# Patient Record
Sex: Female | Born: 1960 | Race: White | Hispanic: No | Marital: Married | Smoking: Never smoker
Health system: Southern US, Community
[De-identification: ages and names within clinical notes are randomized; demographics above are authoritative.]

## PROBLEM LIST (undated history)

## (undated) DIAGNOSIS — I219 Acute myocardial infarction, unspecified: Secondary | ICD-10-CM

## (undated) DIAGNOSIS — K219 Gastro-esophageal reflux disease without esophagitis: Secondary | ICD-10-CM

## (undated) DIAGNOSIS — E785 Hyperlipidemia, unspecified: Secondary | ICD-10-CM

## (undated) DIAGNOSIS — J45909 Unspecified asthma, uncomplicated: Secondary | ICD-10-CM

## (undated) HISTORY — DX: Hyperlipidemia, unspecified: E78.5

## (undated) HISTORY — PX: CHOLECYSTECTOMY: SHX55

## (undated) HISTORY — PX: ABDOMINAL HYSTERECTOMY: SHX81

## (undated) HISTORY — DX: Unspecified asthma, uncomplicated: J45.909

## (undated) HISTORY — DX: Gastro-esophageal reflux disease without esophagitis: K21.9

---

## 2007-12-23 ENCOUNTER — Other Ambulatory Visit: Admission: RE | Admit: 2007-12-23 | Discharge: 2007-12-23 | Payer: Self-pay | Admitting: Internal Medicine

## 2008-01-31 ENCOUNTER — Ambulatory Visit (HOSPITAL_COMMUNITY): Admission: RE | Admit: 2008-01-31 | Discharge: 2008-01-31 | Payer: Self-pay | Admitting: Internal Medicine

## 2009-11-18 ENCOUNTER — Observation Stay (HOSPITAL_COMMUNITY): Admission: EM | Admit: 2009-11-18 | Discharge: 2009-11-19 | Payer: Self-pay | Admitting: Emergency Medicine

## 2009-11-19 ENCOUNTER — Encounter (INDEPENDENT_AMBULATORY_CARE_PROVIDER_SITE_OTHER): Payer: Self-pay | Admitting: General Surgery

## 2011-01-29 LAB — CBC
Hemoglobin: 13.4 g/dL (ref 12.0–15.0)
MCHC: 33.1 g/dL (ref 30.0–36.0)
MCV: 89.2 fL (ref 78.0–100.0)
Platelets: 251 10*3/uL (ref 150–400)
RDW: 13.3 % (ref 11.5–15.5)
WBC: 9.6 10*3/uL (ref 4.0–10.5)

## 2011-01-29 LAB — DIFFERENTIAL
Basophils Absolute: 0.1 10*3/uL (ref 0.0–0.1)
Basophils Relative: 1 % (ref 0–1)
Eosinophils Absolute: 0.1 10*3/uL (ref 0.0–0.7)
Neutro Abs: 6.7 10*3/uL (ref 1.7–7.7)
Neutrophils Relative %: 70 % (ref 43–77)

## 2011-01-29 LAB — URINALYSIS, ROUTINE W REFLEX MICROSCOPIC
Bilirubin Urine: NEGATIVE
Nitrite: NEGATIVE
Specific Gravity, Urine: 1.02 (ref 1.005–1.030)
Urobilinogen, UA: 0.2 mg/dL (ref 0.0–1.0)
pH: 5 (ref 5.0–8.0)

## 2011-01-29 LAB — LIPASE, BLOOD: Lipase: 24 U/L (ref 11–59)

## 2011-01-29 LAB — BASIC METABOLIC PANEL
GFR calc Af Amer: >60 mL/min (ref >60)
Potassium: 4.2 mEq/L (ref 3.5–5.1)
Sodium: 139 mEq/L (ref 135–145)

## 2011-01-29 LAB — HEPATIC FUNCTION PANEL
ALT: 13 U/L (ref 0–35)
AST: 16 U/L (ref 0–37)
Alkaline Phosphatase: 72 U/L (ref 39–117)
Indirect Bilirubin: 0.9 mg/dL (ref 0.3–0.9)
Total Protein: 7.2 g/dL (ref 6.0–8.3)

## 2011-03-31 NOTE — Op Note (Signed)
Jennifer Morton, Jennifer Morton                 ACCOUNT NO.:  [id number]   MEDICAL RECORD NO.:  [id number]          PATIENT TYPE:  OBV   LOCATION:  A311                          FACILITY:  [facility]   PHYSICIAN:  Dalia Heading, M.D.  DATE OF BIRTH:  1960/11/18   DATE OF PROCEDURE:  11/19/2008  DATE OF DISCHARGE:                               OPERATIVE REPORT   PREOPERATIVE DIAGNOSES:  Cholecystitis, cholelithiasis.   POSTOPERATIVE DIAGNOSIS:  Cholecystitis, cholelithiasis.   PROCEDURE:  Laparoscopic cholecystectomy.   SURGEON:  Dalia Heading, MD   ANESTHESIA:  General endotracheal.   INDICATIONS:  The patient is a 50 year old [place] female who presents  with worsening right upper quadrant abdominal pain, nausea, vomiting.  Ultrasound of the gallbladder reveals cholelithiasis with the gallstone  impacted in the neck of the gallbladder.  The patient comes to the  operating for laparoscopic cholecystectomy.  The risks and benefits of  the procedure including bleeding, infection, hepatobiliary injury, the  possibility of an open procedure were fully explained to the patient,  gave informed consent.   PROCEDURE NOTE:  The patient was placed in supine position. After  induction of general endotracheal anesthesia, the abdomen was prepped  and draped using the usual sterile technique with Betadine. Surgical  site confirmation was performed.   A supraumbilical incision was made down to the fascia.  A Veress needle  was introduced into the abdominal cavity and confirmation of placement  was done using the saline drop test.  The abdomen was then insufflated  to 16 mmHg pressure.  An 11-mm trocar was introduced into the abdominal  cavity under direct visualization without difficulty.  The patient was  placed in reversed Trendelenburg position.  Additional 11-mm trocar was  placed in the epigastric region and 5-mm trocars were placed in the  right upper quadrant and right flank regions.  Liver was  inspected and  noted to be within normal limits.  The gallbladder was retracted  superiorly and laterally.  The dissection was begun around the  infundibulum of the gallbladder.  The cystic duct was first identified.  The juncture to the infundibulum fully identified.  Endo clips were  placed proximally and distally on the cystic duct and the cystic duct  was divided.  This was likewise done on cystic artery.  The gallbladder  was then freed away from the gallbladder fossa using Bovie  electrocautery.  The gallbladder was delivered through the epigastric  trocar site using EndoCatch bag.  The gallbladder fossa was inspected.  No abnormal bleeding or bile leakage was noted.  Surgicel was placed in  the gallbladder fossa.  All fluid and air were then evacuated from the  abdominal cavity prior to the removal of the trocars.   All wounds were irrigated with normal saline.  All wounds were checked  with 0.5% Sensorcaine.  The supraumbilical fascia was reapproximated  using an 0 Vicryl interrupted suture.  All skin incisions were closed  using staples.  Betadine ointment and dry sterile dressings were  applied.   All tape and needle counts were correct at the end  of the procedure.  The patient was extubated in the operating room and went back to  recovery room in awake and stable condition.   COMPLICATIONS:  None.   SPECIMEN:  Gallbladder.   BLOOD LOSS:  Minimal.      Dalia Heading, M.D.  Electronically Signed     MAJ/MEDQ  D:  11/19/2009  T:  11/20/2009  Job:  [id number]

## 2011-12-08 ENCOUNTER — Other Ambulatory Visit (HOSPITAL_COMMUNITY): Payer: Self-pay | Admitting: Physician Assistant

## 2011-12-08 DIAGNOSIS — Z1231 Encounter for screening mammogram for malignant neoplasm of breast: Secondary | ICD-10-CM

## 2011-12-11 ENCOUNTER — Ambulatory Visit (HOSPITAL_COMMUNITY)
Admission: RE | Admit: 2011-12-11 | Discharge: 2011-12-11 | Disposition: A | Discharge status: Home / Self Care | Payer: No Typology Code available for payment source | Source: Ambulatory Visit | Attending: Physician Assistant | Admitting: Physician Assistant

## 2011-12-11 DIAGNOSIS — Z1231 Encounter for screening mammogram for malignant neoplasm of breast: Secondary | ICD-10-CM

## 2012-05-26 ENCOUNTER — Emergency Department (HOSPITAL_COMMUNITY)
Admission: EM | Admit: 2012-05-26 | Discharge: 2012-05-26 | Disposition: A | Discharge status: Home / Self Care | Payer: No Typology Code available for payment source | Attending: Emergency Medicine | Admitting: Emergency Medicine

## 2012-05-26 ENCOUNTER — Emergency Department (HOSPITAL_COMMUNITY): Payer: No Typology Code available for payment source

## 2012-05-26 ENCOUNTER — Encounter (HOSPITAL_COMMUNITY): Payer: Self-pay

## 2012-05-26 DIAGNOSIS — S40019A Contusion of unspecified shoulder, initial encounter: Secondary | ICD-10-CM | POA: Insufficient documentation

## 2012-05-26 DIAGNOSIS — S63502A Unspecified sprain of left wrist, initial encounter: Secondary | ICD-10-CM

## 2012-05-26 DIAGNOSIS — W010XXA Fall on same level from slipping, tripping and stumbling without subsequent striking against object, initial encounter: Secondary | ICD-10-CM | POA: Insufficient documentation

## 2012-05-26 DIAGNOSIS — S40012A Contusion of left shoulder, initial encounter: Secondary | ICD-10-CM

## 2012-05-26 DIAGNOSIS — S63509A Unspecified sprain of unspecified wrist, initial encounter: Secondary | ICD-10-CM | POA: Insufficient documentation

## 2012-05-26 MED ORDER — ONDANSETRON HCL 4 MG PO TABS
4.0000 mg | ORAL_TABLET | Freq: Once | ORAL | Status: AC
  Administered 2012-05-26: 4 mg via ORAL
  Filled 2012-05-26: qty 1

## 2012-05-26 MED ORDER — HYDROCODONE-ACETAMINOPHEN 5-325 MG PO TABS: 2.0000 | ORAL_TABLET | ORAL | Status: AC | PRN

## 2012-05-26 MED ORDER — IBUPROFEN 800 MG PO TABS
800.0000 mg | ORAL_TABLET | Freq: Once | ORAL | Status: AC
  Administered 2012-05-26: 800 mg via ORAL
  Filled 2012-05-26: qty 1

## 2012-05-26 MED ORDER — IBUPROFEN 800 MG PO TABS: 800.0000 mg | ORAL_TABLET | Freq: Three times a day (TID) | ORAL | Status: AC

## 2012-05-26 MED ORDER — HYDROCODONE-ACETAMINOPHEN 5-325 MG PO TABS
1.0000 | ORAL_TABLET | Freq: Once | ORAL | Status: AC
  Administered 2012-05-26: 1 via ORAL
  Filled 2012-05-26: qty 1

## 2012-05-26 NOTE — ED Notes (Signed)
Wrist splint and arm sling applied to left arm.

## 2012-05-26 NOTE — ED Provider Notes (Signed)
History     CSN: [id number]  Arrival date & time 05/26/12  1816   First MD Initiated Contact with Patient 05/26/12 1920      Chief Complaint  Patient presents with  . Fall    (Consider location/radiation/quality/duration/timing/severity/associated sxs/prior treatment) HPI Comments: Patient states that while she was shopping tonight she slipped on a wet area on the floor and injured her left shoulder her left wrist and has some soreness in her left hip. The patient denies any loss of consciousness. The patient denies being on any blood thinning type medications. She's not had any previous surgeries.  Patient is a 51 y.o. female presenting with fall. The history is provided by the patient.  Fall Pertinent negatives include no abdominal pain and no hematuria.    History reviewed. No pertinent past medical history.  Past Surgical History  Procedure Date  . Abdominal hysterectomy   . Cholecystectomy     No family history on file.  History  Substance Use Topics  . Smoking status: Never Smoker   . Smokeless tobacco: Not on file  . Alcohol Use: No    OB History    Grav Para Term Preterm Abortions TAB SAB Ect Mult Living                  Review of Systems  Constitutional: Negative for activity change.       All ROS Neg except as noted in HPI  HENT: Negative for nosebleeds and neck pain.   Eyes: Negative for photophobia and discharge.  Respiratory: Negative for cough, shortness of breath and wheezing.   Cardiovascular: Negative for chest pain and palpitations.  Gastrointestinal: Negative for abdominal pain and blood in stool.  Genitourinary: Negative for dysuria, frequency and hematuria.  Musculoskeletal: Negative for back pain and arthralgias.  Skin: Negative.   Neurological: Negative for dizziness, seizures and speech difficulty.  Psychiatric/Behavioral: Negative for hallucinations and confusion.    Allergies  Penicillins  Home Medications  No current  outpatient prescriptions on file.  BP 127/82  Pulse 75  Temp 98.9 F (37.2 C) (Oral)  Resp 20  Ht 5\' 4"  (1.626 m)  Wt 184 lb (83.462 kg)  BMI 31.58 kg/m2  SpO2 100%  Physical Exam  Nursing note and vitals reviewed. Constitutional: She is oriented to person, place, and time. She appears well-developed and well-nourished.  Non-toxic appearance.  HENT:  Head: Normocephalic.  Right Ear: Tympanic membrane and external ear normal.  Left Ear: Tympanic membrane and external ear normal.  Eyes: EOM and lids are normal. Pupils are equal, round, and reactive to light.  Neck: Normal range of motion. Neck supple. Carotid bruit is not present.  Cardiovascular: Normal rate, regular rhythm, normal heart sounds, intact distal pulses and normal pulses.   Pulmonary/Chest: Breath sounds normal. No respiratory distress.  Abdominal: Soft. Bowel sounds are normal. There is no tenderness. There is no guarding.  Musculoskeletal: Normal range of motion.       There is soreness with attempted range of motion of the left shoulder. There is no pain to the right or left clavicle. There is no deformity of the left shoulder. There is no deformity of the left elbow. There is pain with attempted range of motion of the left wrist. There is no deformity noted of the wrist. There is full range of motion of the fingers. There is good capillary refill of the fingers of the right and the left hand. The patient has soreness of the hip with  change of position but ambulates without any problem whatsoever. There is full range of motion of both knees, ankles, and right and left toes.  Lymphadenopathy:       Head (right side): No submandibular adenopathy present.       Head (left side): No submandibular adenopathy present.    She has no cervical adenopathy.  Neurological: She is alert and oriented to person, place, and time. She has normal strength. No cranial nerve deficit or sensory deficit.  Skin: Skin is warm and dry.    Psychiatric: She has a normal mood and affect. Her speech is normal.    ED Course  Procedures (including critical care time)  Labs Reviewed - No data to display Dg Wrist Complete Left  05/26/2012  *RADIOLOGY REPORT*  Clinical Data: Fall today.  Wrist injury.  LEFT WRIST - COMPLETE 3+ VIEW  Comparison: None.  Findings: The mineralization and alignment are normal.  There is no evidence of acute fracture or dislocation.  Deformity of the fourth metacarpal base may be related to an old injury.  There are mild cystic changes at the scapholunate articulation without associated diastasis.  No focal soft tissue swelling is evident.  IMPRESSION: No acute osseous findings.  Original Report Authenticated By: Gerrianne Scale, M.D.   Dg Shoulder Left  05/26/2012  *RADIOLOGY REPORT*  Clinical Data: Fall today.  Shoulder injury.  LEFT SHOULDER - 2+ VIEW  Comparison: None.  Findings: The mineralization and alignment are normal.  There is no evidence of acute fracture or dislocation.  No soft tissue abnormalities are identified. The subacromial space is preserved. There are mild acromioclavicular degenerative changes.  IMPRESSION: No acute osseous findings.  Original Report Authenticated By: Gerrianne Scale, M.D.     No diagnosis found.    MDM  I have reviewed nursing notes, vital signs, and all appropriate lab and imaging results for this patient. The wrist x-ray is negative for fracture. The left shoulder x-ray is negative for fracture or dislocation. The patient is fitted with a Velcro wrist splint and a sling. Prescription for Mobic 7.5 mg and Norco 5 mg given to the patient. Patient is to follow with her primary physician this week.       Kathie Dike, [place] 05/26/12 2049

## 2012-05-26 NOTE — ED Notes (Signed)
Pt stated she slipped on water and now having left arm and left hip pain

## 2012-05-27 NOTE — ED Provider Notes (Signed)
Medical screening examination/treatment/procedure(s) were performed by non-physician practitioner and as supervising physician I was immediately available for consultation/collaboration. Oumou Smead, MD, FACEP   Tianah Lonardo L Rudie Rikard, MD 05/27/12 0031 

## 2012-10-03 ENCOUNTER — Other Ambulatory Visit (HOSPITAL_COMMUNITY): Payer: Self-pay | Admitting: Physician Assistant

## 2012-10-03 DIAGNOSIS — Z139 Encounter for screening, unspecified: Secondary | ICD-10-CM

## 2012-12-12 ENCOUNTER — Ambulatory Visit (HOSPITAL_COMMUNITY): Payer: Self-pay

## 2012-12-16 ENCOUNTER — Ambulatory Visit (HOSPITAL_COMMUNITY): Payer: Self-pay

## 2012-12-23 ENCOUNTER — Ambulatory Visit (HOSPITAL_COMMUNITY): Payer: Self-pay

## 2012-12-30 ENCOUNTER — Inpatient Hospital Stay (HOSPITAL_COMMUNITY): Admission: RE | Admit: 2012-12-30 | Payer: Self-pay | Source: Ambulatory Visit

## 2015-07-08 ENCOUNTER — Encounter: Payer: Self-pay | Admitting: Obstetrics and Gynecology

## 2015-07-08 ENCOUNTER — Ambulatory Visit (INDEPENDENT_AMBULATORY_CARE_PROVIDER_SITE_OTHER): Payer: Self-pay | Admitting: Obstetrics and Gynecology

## 2015-07-08 VITALS — BP 110/70 | Ht 64.0 in | Wt 173.0 lb

## 2015-07-08 DIAGNOSIS — N993 Prolapse of vaginal vault after hysterectomy: Secondary | ICD-10-CM

## 2015-07-08 DIAGNOSIS — N8110 Cystocele, unspecified: Secondary | ICD-10-CM

## 2015-07-08 DIAGNOSIS — IMO0002 Reserved for concepts with insufficient information to code with codable children: Secondary | ICD-10-CM

## 2015-07-08 NOTE — Progress Notes (Signed)
   [facility] Visit  Patient name: Jennifer Morton MRN [id number]  Date of birth: Apr 22, 1961  CC & HPI:  Jennifer Morton is a 54 y.o. female with a history of abdominal hysterectomy presenting today for a possible bladder prolapse and cystocele that has been present for "a while" but recently has been more uncomfortable. She notes associated pelvic pain and stress incontinence with coughing and sneezing.    ROS:  A complete review of systems was obtained and all systems are negative except as noted in the HPI and PMH.    Pertinent History Reviewed:   Reviewed: Significant for abdominal hysterectomy allegedly when pt was in 20's tho details are verbal only. Medical         Past Medical History  Diagnosis Date  . GERD (gastroesophageal reflux disease)                               Surgical Hx:    Past Surgical History  Procedure Laterality Date  . Abdominal hysterectomy    . Cholecystectomy     Medications: Reviewed & Updated - see associated section                       Current outpatient prescriptions:  .  esomeprazole (NEXIUM) 40 MG capsule, Take 40 mg by mouth daily at 12 noon., Disp: , Rfl:    Social History: Reviewed -  reports that she has never smoked. She has never used smokeless tobacco.  Objective Findings:  Vitals: Blood pressure 110/70, height  (1.626 m), weight 173 lb (78.472 kg).  Physical Examination: General appearance - alert, well appearing, and in no distress, oriented to person, place, and time and overweight Mental status - alert, oriented to person, place, and time, normal mood, behavior, speech, dress, motor activity, and thought processes, affect appropriate to mood Pelvic - normal external genitalia  VULVA: normal appearing vulva with no masses, tenderness or lesions,  VAGINA: apical prolapse UTERUS: surgically absent RECTAL: hemorrhoids  Assessment & Plan:   A:  1. Cystocele 2. Possible candidate for [facility] discount 3. Discussed  surgical options  P:  1. Schedule pessary fitting in 2 weeks  2.   This chart was SCRIBED for Christin Bach, MD by Ronney Lion, ED Scribe. This patient was seen in room 1, and the patient's care was started at 2:10 PM.  I personally performed the services described in this documentation, which was SCRIBED in my presence. The recorded information has been reviewed and considered accurate. It has been edited as necessary during review. Tilda Burrow, MD

## 2015-07-08 NOTE — Progress Notes (Signed)
Patient ID: Jennifer Morton, female   DOB: 04-May-1961, 54 y.o.   MRN: [id number] Pt here today for a bladder prolapse and cystocele. Pt states that she has had this problem for a little while it has just gotten worse.

## 2015-07-16 ENCOUNTER — Other Ambulatory Visit: Payer: Self-pay | Admitting: Obstetrics and Gynecology

## 2015-07-16 DIAGNOSIS — IMO0002 Reserved for concepts with insufficient information to code with codable children: Secondary | ICD-10-CM

## 2015-07-21 ENCOUNTER — Other Ambulatory Visit: Payer: Self-pay

## 2015-07-21 ENCOUNTER — Ambulatory Visit: Payer: Self-pay | Admitting: Obstetrics and Gynecology

## 2015-07-22 ENCOUNTER — Other Ambulatory Visit: Payer: Self-pay

## 2015-08-02 ENCOUNTER — Telehealth: Payer: Self-pay | Admitting: Obstetrics & Gynecology

## 2015-08-02 NOTE — Telephone Encounter (Signed)
Pt states "bladder keeps dropping out causing a lot of abdominal and lower back pressure." Pt states she has seen Dr. Emelda Fear in the past and his scheduled to f/u on 08/13/2015 but does not know if she can wait that long. Pt informed Dr. Emelda Fear out of the office until 08/09/2015. Pt states she did not want to do the pessary due to being sexually active.

## 2015-08-02 NOTE — Telephone Encounter (Signed)
Pessary is the only interim solution, nothing else to offer short of pessaey and ultimately surgical correction  Recommend keeping scheduled appt

## 2015-08-12 ENCOUNTER — Other Ambulatory Visit: Payer: Self-pay

## 2015-08-13 ENCOUNTER — Other Ambulatory Visit: Payer: Self-pay

## 2015-08-13 ENCOUNTER — Ambulatory Visit: Payer: Self-pay | Admitting: Obstetrics and Gynecology

## 2015-11-14 HISTORY — PX: BLADDER SURGERY: SHX569

## 2018-09-12 ENCOUNTER — Ambulatory Visit: Payer: Self-pay | Admitting: Physician Assistant

## 2018-09-16 ENCOUNTER — Encounter: Payer: Self-pay | Admitting: Physician Assistant

## 2018-09-16 ENCOUNTER — Ambulatory Visit: Payer: Self-pay | Admitting: Physician Assistant

## 2018-09-16 ENCOUNTER — Other Ambulatory Visit: Payer: Self-pay | Admitting: Physician Assistant

## 2018-09-16 VITALS — BP 126/79 | HR 68 | Temp 97.9°F | Ht 62.75 in | Wt 176.0 lb

## 2018-09-16 DIAGNOSIS — Z720 Tobacco use: Secondary | ICD-10-CM

## 2018-09-16 DIAGNOSIS — Z7689 Persons encountering health services in other specified circumstances: Secondary | ICD-10-CM

## 2018-09-16 DIAGNOSIS — R7303 Prediabetes: Secondary | ICD-10-CM

## 2018-09-16 DIAGNOSIS — E785 Hyperlipidemia, unspecified: Secondary | ICD-10-CM

## 2018-09-16 DIAGNOSIS — Z1211 Encounter for screening for malignant neoplasm of colon: Secondary | ICD-10-CM

## 2018-09-16 DIAGNOSIS — Z5320 Procedure and treatment not carried out because of patient's decision for unspecified reasons: Secondary | ICD-10-CM

## 2018-09-16 DIAGNOSIS — K219 Gastro-esophageal reflux disease without esophagitis: Secondary | ICD-10-CM

## 2018-09-16 MED ORDER — SUCRALFATE 1 G PO TABS: 1.0000 g | ORAL_TABLET | Freq: Four times a day (QID) | ORAL | 3 refills | Status: DC

## 2018-09-16 NOTE — Progress Notes (Signed)
BP 126/79 (BP Location: Right Arm, Patient Position: Sitting, Cuff Size: Normal)   Pulse 68   Temp 97.9 F (36.6 C)   Ht 5' 2.75" (1.594 m)   Wt 176 lb (79.8 kg)   SpO2 97%   BMI 31.43 kg/m    Subjective:    Patient ID: Jennifer Morton, female    DOB: 21-Jul-1961, 57 y.o.   MRN: [id number]  HPI: Jennifer Morton is a 57 y.o. female presenting on 09/16/2018 for New Patient (Initial Visit) (previous pt with [facility]. pt last went there Oct. 1, 2019)   HPI   Chief Complaint  Patient presents with  . New Patient (Initial Visit)    previous pt with [facility]. pt last went there Oct. 1, 2019    Pt says she stopped going to [facility] because they moved (the clinic)  Pt previously treated at [facility].  Last seen here 10/02/2012  Pt says she just had labs done first part of October  Pt has had no mammogram or colon cancer screening test since she was a pt here 6 years ago  She is taking her omeprazole bid and the sucralfate.  She says she is doing well as long as she takes her stomach medicines and she has no complaints.   Relevant past medical, surgical, family and social history reviewed and updated as indicated. Interim medical history since our last visit reviewed. Allergies and medications reviewed and updated.   Current Outpatient Medications:  .  atorvastatin (LIPITOR) 10 MG tablet, Take 10 mg by mouth daily., Disp: , Rfl:  .  cetirizine (ZYRTEC) 10 MG tablet, Take 10 mg by mouth daily., Disp: , Rfl:  .  omeprazole (PRILOSEC) 20 MG capsule, Take 20 mg by mouth daily., Disp: , Rfl:  .  sucralfate (CARAFATE) 1 g tablet, Take 1 g by mouth 4 (four) times daily. 4 times a day and at bedtime, Disp: , Rfl:    Review of Systems  Constitutional: Negative for appetite change, chills, diaphoresis, fatigue, fever and unexpected weight change.  HENT: Positive for dental problem. Negative for congestion, drooling, ear pain, facial swelling, hearing loss, mouth sores, sneezing, sore  throat, trouble swallowing and voice change.   Eyes: Positive for visual disturbance. Negative for pain, discharge, redness and itching.  Respiratory: Negative for cough, choking, shortness of breath and wheezing.   Cardiovascular: Negative for chest pain, palpitations and leg swelling.  Gastrointestinal: Positive for abdominal pain. Negative for blood in stool, constipation, diarrhea and vomiting.  Endocrine: Negative for cold intolerance, heat intolerance and polydipsia.  Genitourinary: Negative for decreased urine volume, dysuria and hematuria.  Musculoskeletal: Positive for back pain. Negative for arthralgias and gait problem.  Skin: Negative for rash.  Allergic/Immunologic: Negative for environmental allergies.  Neurological: Positive for headaches. Negative for seizures, syncope and light-headedness.  Hematological: Negative for adenopathy.  Psychiatric/Behavioral: Negative for agitation, dysphoric mood and suicidal ideas. The patient is not nervous/anxious.     Per HPI unless specifically indicated above     Objective:    BP 126/79 (BP Location: Right Arm, Patient Position: Sitting, Cuff Size: Normal)   Pulse 68   Temp 97.9 F (36.6 C)   Ht 5' 2.75" (1.594 m)   Wt 176 lb (79.8 kg)   SpO2 97%   BMI 31.43 kg/m   Wt Readings from Last 3 Encounters:  09/16/18 176 lb (79.8 kg)  07/08/15 173 lb (78.5 kg)  05/26/12 184 lb (83.5 kg)    Physical  Exam  Constitutional: She is oriented to person, place, and time. She appears well-developed and well-nourished.  HENT:  Head: Normocephalic and atraumatic.  Mouth/Throat: Uvula is midline and oropharynx is clear and moist. Abnormal dentition. No uvula swelling. No oropharyngeal exudate.  Brown tongue  Eyes: Pupils are equal, round, and reactive to light. Conjunctivae and EOM are normal.  Neck: Neck supple. No thyromegaly present.  Cardiovascular: Normal rate and regular rhythm.  Pulmonary/Chest: Effort normal and breath sounds  normal.  Abdominal: Soft. Bowel sounds are normal. She exhibits no mass. There is no hepatosplenomegaly. There is no tenderness.  Musculoskeletal: She exhibits no edema.  Lymphadenopathy:    She has no cervical adenopathy.  Neurological: She is alert and oriented to person, place, and time. Gait normal.  Skin: Skin is warm and dry.  Psychiatric: She has a normal mood and affect. Her behavior is normal.  Vitals reviewed.       Assessment & Plan:    Encounter Diagnoses  Name Primary?  . Encounter to establish care Yes  . Hyperlipidemia, unspecified hyperlipidemia type   . Gastroesophageal reflux disease, esophagitis presence not specified   . Screening for colon cancer   . Mammogram declined   . Chewing tobacco use     -Pt declines mammogram because she can't make it to [facility] (and that is where funding for free screening is currently).   -pt is given iFOBT for colon cancer screening -pt to Continue current medications -record request sent to [facility] for most recent labs -pt to Follow up 2 months.  RTO sooner prn

## 2018-10-08 ENCOUNTER — Encounter: Payer: Self-pay | Admitting: Physician Assistant

## 2018-10-15 ENCOUNTER — Other Ambulatory Visit (HOSPITAL_COMMUNITY)
Admission: RE | Admit: 2018-10-15 | Discharge: 2018-10-15 | Disposition: A | Discharge status: Home / Self Care | Payer: Self-pay | Source: Ambulatory Visit | Attending: Physician Assistant | Admitting: Physician Assistant

## 2018-10-15 DIAGNOSIS — E785 Hyperlipidemia, unspecified: Secondary | ICD-10-CM | POA: Insufficient documentation

## 2018-10-15 DIAGNOSIS — R7303 Prediabetes: Secondary | ICD-10-CM | POA: Insufficient documentation

## 2018-10-15 LAB — COMPREHENSIVE METABOLIC PANEL
ALBUMIN: 4.2 g/dL (ref 3.5–5.0)
ALT: 21 U/L (ref 0–44)
ANION GAP: 10 (ref 5–15)
AST: 22 U/L (ref 15–41)
Alkaline Phosphatase: 86 U/L (ref 38–126)
BUN: 14 mg/dL (ref 6–20)
CHLORIDE: 108 mmol/L (ref 98–111)
CO2: 23 mmol/L (ref 22–32)
Calcium: 9.1 mg/dL (ref 8.9–10.3)
Creatinine, Ser: 0.69 mg/dL (ref 0.44–1.00)
GFR calc Af Amer: >60 mL/min (ref >60)
GFR calc non Af Amer: >60 mL/min (ref >60)
GLUCOSE: 84 mg/dL (ref 70–99)
POTASSIUM: 4.1 mmol/L (ref 3.5–5.1)
SODIUM: 141 mmol/L (ref 135–145)
TOTAL PROTEIN: 7.7 g/dL (ref 6.5–8.1)
Total Bilirubin: 0.5 mg/dL (ref 0.3–1.2)

## 2018-10-15 LAB — LIPID PANEL
CHOL/HDL RATIO: 4.2 ratio
CHOLESTEROL: 233 mg/dL — AB (ref 0–200)
HDL: 56 mg/dL (ref >40)
LDL CALC: 118 mg/dL — AB (ref 0–99)
Triglycerides: 294 mg/dL — ABNORMAL HIGH (ref <150)
VLDL: 59 mg/dL — AB (ref 0–40)

## 2018-10-15 LAB — HEMOGLOBIN A1C
Hgb A1c MFr Bld: 5.6 % (ref 4.8–5.6)
Mean Plasma Glucose: 114.02 mg/dL

## 2018-10-22 ENCOUNTER — Ambulatory Visit: Payer: Self-pay | Admitting: Physician Assistant

## 2018-10-22 ENCOUNTER — Encounter: Payer: Self-pay | Admitting: Physician Assistant

## 2018-10-22 VITALS — BP 120/80 | HR 67 | Temp 97.7°F | Ht 62.75 in | Wt 177.0 lb

## 2018-10-22 DIAGNOSIS — K219 Gastro-esophageal reflux disease without esophagitis: Secondary | ICD-10-CM

## 2018-10-22 DIAGNOSIS — Z1211 Encounter for screening for malignant neoplasm of colon: Secondary | ICD-10-CM

## 2018-10-22 DIAGNOSIS — E785 Hyperlipidemia, unspecified: Secondary | ICD-10-CM

## 2018-10-22 MED ORDER — ATORVASTATIN CALCIUM 20 MG PO TABS: 20.0000 mg | ORAL_TABLET | Freq: Every day | ORAL | 1 refills | Status: DC

## 2018-10-22 MED ORDER — OMEPRAZOLE 40 MG PO CPDR: 40.0000 mg | DELAYED_RELEASE_CAPSULE | Freq: Every day | ORAL | 1 refills | Status: DC

## 2018-10-22 NOTE — Progress Notes (Signed)
BP 120/80 (BP Location: Right Arm, Patient Position: Sitting, Cuff Size: Normal)   Pulse 67   Temp 97.7 F (36.5 C)   Ht 5' 2.75" (1.594 m)   Wt 177 lb (80.3 kg)   SpO2 93%   BMI 31.60 kg/m    Subjective:    Patient ID: Jennifer Morton, female    DOB: [birth year], 59 y.o.   MRN: [id number]  HPI: Jennifer Morton is a 57 y.o. female presenting on 10/22/2018 for Hyperlipidemia and Gastroesophageal Reflux   HPI   Pt stopped taking the carafate because she says it doesn't help any more.  She is taking omeprazole 20 mg bid.  Pt says burning and pain most of the time.   She says the burning 6 or 7 months and it's getting worse.   Relevant past medical, surgical, family and social history reviewed and updated as indicated. Interim medical history since our last visit reviewed. Allergies and medications reviewed and updated.   Current Outpatient Medications:  .  atorvastatin (LIPITOR) 10 MG tablet, Take 10 mg by mouth daily., Disp: , Rfl:  .  cetirizine (ZYRTEC) 10 MG tablet, Take 10 mg by mouth daily., Disp: , Rfl:  .  omeprazole (PRILOSEC) 20 MG capsule, Take 20 mg by mouth daily., Disp: , Rfl:  .  sucralfate (CARAFATE) 1 g tablet, Take 1 tablet (1 g total) by mouth 4 (four) times daily. 4 times a day and at bedtime (Patient not taking: Reported on 10/22/2018), Disp: 120 tablet, Rfl: 3   Review of Systems  Constitutional: Negative for appetite change, chills, diaphoresis, fatigue, fever and unexpected weight change.  HENT: Negative for congestion, dental problem, drooling, ear pain, facial swelling, hearing loss, mouth sores, sneezing, sore throat, trouble swallowing and voice change.   Eyes: Negative for pain, discharge, redness, itching and visual disturbance.  Respiratory: Negative for cough, choking, shortness of breath and wheezing.   Cardiovascular: Negative for chest pain, palpitations and leg swelling.  Gastrointestinal: Positive for abdominal pain. Negative for blood in stool,  constipation, diarrhea and vomiting.  Endocrine: Negative for cold intolerance, heat intolerance and polydipsia.  Genitourinary: Negative for decreased urine volume, dysuria and hematuria.  Musculoskeletal: Positive for back pain. Negative for arthralgias and gait problem.  Skin: Negative for rash.  Allergic/Immunologic: Negative for environmental allergies.  Neurological: Positive for headaches. Negative for seizures, syncope and light-headedness.  Hematological: Negative for adenopathy.  Psychiatric/Behavioral: Negative for agitation, dysphoric mood and suicidal ideas. The patient is not nervous/anxious.     Per HPI unless specifically indicated above     Objective:    BP 120/80 (BP Location: Right Arm, Patient Position: Sitting, Cuff Size: Normal)   Pulse 67   Temp 97.7 F (36.5 C)   Ht 5' 2.75" (1.594 m)   Wt 177 lb (80.3 kg)   SpO2 93%   BMI 31.60 kg/m   Wt Readings from Last 3 Encounters:  10/22/18 177 lb (80.3 kg)  09/16/18 176 lb (79.8 kg)  07/08/15 173 lb (78.5 kg)    Physical Exam  Constitutional: She is oriented to person, place, and time. She appears well-developed and well-nourished.  HENT:  Head: Normocephalic and atraumatic.  Neck: Neck supple.  Cardiovascular: Normal rate and regular rhythm.  Pulmonary/Chest: Effort normal and breath sounds normal.  Abdominal: Soft. Bowel sounds are normal. She exhibits no mass. There is no hepatosplenomegaly. There is no tenderness.  Musculoskeletal: She exhibits no edema.  Lymphadenopathy:    She has no cervical adenopathy.  Neurological: She is alert and oriented to person, place, and time.  Skin: Skin is warm and dry.  Psychiatric: She has a normal mood and affect. Her behavior is normal.  Vitals reviewed.   Results for orders placed or performed during the hospital encounter of 10/15/18  Hemoglobin A1c  Result Value Ref Range   Hgb A1c MFr Bld 5.6 4.8 - 5.6 %   Mean Plasma Glucose 114.02 mg/dL  Lipid panel   Result Value Ref Range   Cholesterol 233 (H) 0 - 200 mg/dL   Triglycerides 161294 (H) <150 mg/dL   HDL 56 >09>40 mg/dL   Total CHOL/HDL Ratio 4.2 RATIO   VLDL 59 (H) 0 - 40 mg/dL   LDL Cholesterol 604118 (H) 0 - 99 mg/dL  Comprehensive metabolic panel  Result Value Ref Range   Sodium 141 135 - 145 mmol/L   Potassium 4.1 3.5 - 5.1 mmol/L   Chloride 108 98 - 111 mmol/L   CO2 23 22 - 32 mmol/L   Glucose, Bld 84 70 - 99 mg/dL   BUN 14 6 - 20 mg/dL   Creatinine, Ser 5.400.69 0.44 - 1.00 mg/dL   Calcium 9.1 8.9 - 98.110.3 mg/dL   Total Protein 7.7 6.5 - 8.1 g/dL   Albumin 4.2 3.5 - 5.0 g/dL   AST 22 15 - 41 U/L   ALT 21 0 - 44 U/L   Alkaline Phosphatase 86 38 - 126 U/L   Total Bilirubin 0.5 0.3 - 1.2 mg/dL   GFR calc non Af Amer >60 >60 mL/min   GFR calc Af Amer >60 >60 mL/min   Anion gap 10 5 - 15      Assessment & Plan:    Encounter Diagnoses  Name Primary?  . Hyperlipidemia, unspecified hyperlipidemia type Yes  . Gastroesophageal reflux disease, esophagitis presence not specified   . Screening for colon cancer     -Reviewed labs with pt -will Increase lipitor to 20mg .  Pt counseled to Watch lowfat diet -will Increase omeprazole to 40mg  bid  Gave handout on GERD and counseled on lifestyle changes -pt to follow up 3 months.   RTO sooner prn worsening or new symptoms

## 2018-10-28 LAB — IFOBT (OCCULT BLOOD): IFOBT: NEGATIVE

## 2018-11-30 ENCOUNTER — Emergency Department (HOSPITAL_COMMUNITY)
Admission: EM | Admit: 2018-11-30 | Discharge: 2018-11-30 | Disposition: A | Discharge status: Home / Self Care | Payer: Self-pay | Attending: Emergency Medicine | Admitting: Emergency Medicine

## 2018-11-30 ENCOUNTER — Other Ambulatory Visit: Payer: Self-pay

## 2018-11-30 ENCOUNTER — Encounter (HOSPITAL_COMMUNITY): Payer: Self-pay | Admitting: Emergency Medicine

## 2018-11-30 DIAGNOSIS — K047 Periapical abscess without sinus: Secondary | ICD-10-CM | POA: Insufficient documentation

## 2018-11-30 HISTORY — DX: Acute myocardial infarction, unspecified: I21.9

## 2018-11-30 MED ORDER — DICLOFENAC SODIUM 50 MG PO TBEC: 50.0000 mg | DELAYED_RELEASE_TABLET | Freq: Two times a day (BID) | ORAL | 0 refills | Status: DC

## 2018-11-30 MED ORDER — CLINDAMYCIN HCL 300 MG PO CAPS: 300.0000 mg | ORAL_CAPSULE | Freq: Four times a day (QID) | ORAL | 0 refills | Status: DC

## 2018-11-30 NOTE — ED Provider Notes (Signed)
[facility] EMERGENCY DEPARTMENT Provider Note   CSN: [id number] Arrival date & time: 11/30/18  1121     History   Chief Complaint Chief Complaint  Patient presents with  . Dental Pain    HPI Jennifer Morton is a 58 y.o. female.  The history is provided by the patient. No language interpreter was used.  Dental Pain  Location:  Upper Upper teeth location:  7/RU lateral incisor and 6/RU cuspid Quality:  Aching Severity:  Moderate Onset quality:  Gradual Timing:  Constant Progression:  Worsening Chronicity:  New Relieved by:  Nothing Worsened by:  Nothing Ineffective treatments:  None tried Associated symptoms: facial pain     Past Medical History:  Diagnosis Date  . Asthma   . GERD (gastroesophageal reflux disease)   . Hyperlipidemia   . MI (myocardial infarction) [facility])     Patient Active Problem List   Diagnosis Date Noted  . 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 Cystocele 07/08/2015    Past Surgical History:  Procedure Laterality Date  . ABDOMINAL HYSTERECTOMY    . BLADDER SURGERY  2017   bladder tack  . CHOLECYSTECTOMY       OB History   No obstetric history on file.      Home Medications    Prior to Admission medications   Medication Sig Start Date End Date Taking? Authorizing Provider  atorvastatin (LIPITOR) 20 MG tablet Take 1 tablet (20 mg total) by mouth daily. 10/22/18   Jacquelin Hawking, PA-C  cetirizine (ZYRTEC) 10 MG tablet Take 10 mg by mouth daily.    [provider]  clindamycin (CLEOCIN) 300 MG capsule Take 1 capsule (300 mg total) by mouth 4 (four) times daily. 11/30/18   Elson Areas, PA-C  diclofenac (VOLTAREN) 50 MG EC tablet Take 1 tablet (50 mg total) by mouth 2 (two) times daily. 11/30/18   Elson Areas, PA-C  omeprazole (PRILOSEC) 40 MG capsule Take 1 capsule (40 mg total) by mouth daily. 10/22/18   Jacquelin Hawking, PA-C  sucralfate (CARAFATE) 1 g tablet Take 1 tablet (1 g total) by mouth 4  (four) times daily. 4 times a day and at bedtime Patient not taking: Reported on 10/22/2018 09/16/18   Jacquelin Hawking, PA-C    Family History Family History  Problem Relation Age of Onset  . Heart disease Mother   . Hypertension Mother   . Heart failure Mother   . Hyperlipidemia Mother   . Cancer Father        colon cancer  . Diabetes Father   . COPD Father   . Hearing loss Daughter   . Cancer Maternal Uncle        lung cancer  . Cancer Maternal Uncle        liver cancer  . Cancer Maternal Uncle        back cancer    Social History Social History   Tobacco Use  . Smoking status: Never Smoker  . Smokeless tobacco: Current User    Types: Snuff  Substance Use Topics  . Alcohol use: No  . Drug use: No     Allergies   Penicillins and Prednisone   Review of Systems Review of Systems  HENT: Positive for dental problem.   All other systems reviewed and are negative.    Physical Exam Updated Vital Signs BP 123/82 (BP Location: Right Arm)   Pulse 83   Temp 98.2 F (36.8 C) (Oral)   Resp 16  Ht 5\' 4"  (1.626 m)   Wt 80.7 kg   SpO2 98%   BMI 30.55 kg/m   Physical Exam Vitals signs and nursing note reviewed.  Constitutional:      Appearance: She is well-developed.  HENT:     Head: Normocephalic and atraumatic.     Nose: Nose normal.     Mouth/Throat:     Mouth: Mucous membranes are moist.  Neck:     Musculoskeletal: Normal range of motion.  Cardiovascular:     Rate and Rhythm: Normal rate.  Pulmonary:     Effort: Pulmonary effort is normal.  Abdominal:     General: There is no distension.  Musculoskeletal: Normal range of motion.  Neurological:     Mental Status: She is alert and oriented to person, place, and time.   swelling right face,  swelling gumline. Cavities   ED Treatments / Results  Labs (all labs ordered are listed, but only abnormal results are displayed) Labs Reviewed - No data to display  EKG None  Radiology No results  found.  Procedures Procedures (including critical care time)  Medications Ordered in ED Medications - No data to display   Initial Impression / Assessment and Plan / ED Course  I have reviewed the triage vital signs and the nursing notes.  Pertinent labs & imaging results that were available during my care of the patient were reviewed by me and considered in my medical decision making (see chart for details).       Final Clinical Impressions(s) / ED Diagnoses   Final diagnoses:  Dental abscess    ED Discharge Orders         Ordered    clindamycin (CLEOCIN) 300 MG capsule  4 times daily     11/30/18 1218    diclofenac (VOLTAREN) 50 MG EC tablet  2 times daily     11/30/18 1218        An After Visit Summary was printed and given to the patient.   Elson AreasSofia, Bonnye Halle K, PA-C 11/30/18 1348    Samuel JesterMcManus, Kathleen, DO 12/01/18 [id number]

## 2018-11-30 NOTE — ED Triage Notes (Signed)
Patient c/o right upper dental pain and swelling that started yesterday. Denies any fevers. Per patient broken tooth. Patient reports taking ibuprofen with last dose x1 hour ago. Per patient improvement in pain but not swelling.

## 2018-11-30 NOTE — Discharge Instructions (Addendum)
Return if any problems.  See the dentist for treatment

## 2018-12-24 ENCOUNTER — Ambulatory Visit: Payer: Self-pay | Admitting: Physician Assistant

## 2019-01-20 ENCOUNTER — Other Ambulatory Visit (HOSPITAL_COMMUNITY)
Admission: RE | Admit: 2019-01-20 | Discharge: 2019-01-20 | Disposition: A | Discharge status: Home / Self Care | Payer: Self-pay | Source: Ambulatory Visit | Attending: Physician Assistant | Admitting: Physician Assistant

## 2019-01-20 DIAGNOSIS — E785 Hyperlipidemia, unspecified: Secondary | ICD-10-CM | POA: Insufficient documentation

## 2019-01-20 LAB — COMPREHENSIVE METABOLIC PANEL
ALT: 20 U/L (ref 0–44)
AST: 20 U/L (ref 15–41)
Albumin: 4.1 g/dL (ref 3.5–5.0)
Alkaline Phosphatase: 87 U/L (ref 38–126)
Anion gap: 6 (ref 5–15)
BUN: 14 mg/dL (ref 6–20)
CO2: 24 mmol/L (ref 22–32)
Calcium: 9.2 mg/dL (ref 8.9–10.3)
Chloride: 109 mmol/L (ref 98–111)
Creatinine, Ser: 0.66 mg/dL (ref 0.44–1.00)
GFR calc non Af Amer: >60 mL/min (ref >60)
Glucose, Bld: 94 mg/dL (ref 70–99)
Potassium: 4.4 mmol/L (ref 3.5–5.1)
Sodium: 139 mmol/L (ref 135–145)
Total Bilirubin: 0.2 mg/dL — ABNORMAL LOW (ref 0.3–1.2)
Total Protein: 7.4 g/dL (ref 6.5–8.1)

## 2019-01-20 LAB — LIPID PANEL
Cholesterol: 287 mg/dL — ABNORMAL HIGH (ref 0–200)
HDL: 58 mg/dL (ref >40)
LDL Cholesterol: 178 mg/dL — ABNORMAL HIGH (ref 0–99)
Total CHOL/HDL Ratio: 4.9 RATIO
Triglycerides: 255 mg/dL — ABNORMAL HIGH (ref <150)
VLDL: 51 mg/dL — ABNORMAL HIGH (ref 0–40)

## 2019-01-27 ENCOUNTER — Other Ambulatory Visit: Payer: Self-pay

## 2019-01-27 ENCOUNTER — Ambulatory Visit: Payer: Self-pay | Admitting: Physician Assistant

## 2019-01-27 ENCOUNTER — Encounter: Payer: Self-pay | Admitting: Physician Assistant

## 2019-01-27 VITALS — BP 128/86 | HR 57 | Temp 97.2°F

## 2019-01-27 DIAGNOSIS — K219 Gastro-esophageal reflux disease without esophagitis: Secondary | ICD-10-CM

## 2019-01-27 DIAGNOSIS — Z5320 Procedure and treatment not carried out because of patient's decision for unspecified reasons: Secondary | ICD-10-CM

## 2019-01-27 DIAGNOSIS — E785 Hyperlipidemia, unspecified: Secondary | ICD-10-CM | POA: Insufficient documentation

## 2019-01-27 MED ORDER — ATORVASTATIN CALCIUM 40 MG PO TABS: 40.0000 mg | ORAL_TABLET | Freq: Every day | ORAL | 3 refills | Status: DC

## 2019-01-27 NOTE — Progress Notes (Signed)
BP 128/86   Pulse (!) 57   Temp (!) 97.2 F (36.2 C)   SpO2 98%    Subjective:    Patient ID: Jennifer Morton, female    DOB: [birth year], 58 y.o.   MRN: [id number]  HPI: Jennifer Morton is a 58 y.o. female presenting on 01/27/2019 for Hyperlipidemia and Gastroesophageal Reflux   HPI   Pt states that 40mg  omeprazole helps some but still with epigastric pain at times so she sometimes takes 2 omeprazel daily sometimes and that helps.   Relevant past medical, surgical, family and social history reviewed and updated as indicated. Interim medical history since our last visit reviewed. Allergies and medications reviewed and updated.   CURRENT MEDS: Zyrtec Omeprazole 40mg  qd lipitor 20mg  qd    Review of Systems  Constitutional: Negative for fever.  Respiratory: Negative for shortness of breath.   Cardiovascular: Negative for chest pain.  Gastrointestinal: Positive for abdominal pain.    Per HPI unless specifically indicated above     Objective:    BP 128/86   Pulse (!) 57   Temp (!) 97.2 F (36.2 C)   SpO2 98%   Wt Readings from Last 3 Encounters:  11/30/18 178 lb (80.7 kg)  10/22/18 177 lb (80.3 kg)  09/16/18 176 lb (79.8 kg)    Physical Exam Vitals signs reviewed.  Constitutional:      Appearance: She is well-developed.  HENT:     Head: Normocephalic and atraumatic.  Neck:     Musculoskeletal: Neck supple.  Cardiovascular:     Rate and Rhythm: Normal rate and regular rhythm.  Pulmonary:     Effort: Pulmonary effort is normal.     Breath sounds: Normal breath sounds.  Abdominal:     General: Bowel sounds are normal.     Palpations: Abdomen is soft. There is no mass.     Tenderness: There is no abdominal tenderness.  Lymphadenopathy:     Cervical: No cervical adenopathy.  Skin:    General: Skin is warm and dry.  Neurological:     Mental Status: She is alert and oriented to person, place, and time.  Psychiatric:        Behavior: Behavior normal.      Results for orders placed or performed during the hospital encounter of 01/20/19  Lipid panel  Result Value Ref Range   Cholesterol 287 (H) 0 - 200 mg/dL   Triglycerides 660 (H) <150 mg/dL   HDL 58 >60 mg/dL   Total CHOL/HDL Ratio 4.9 RATIO   VLDL 51 (H) 0 - 40 mg/dL   LDL Cholesterol 045 (H) 0 - 99 mg/dL  Comprehensive metabolic panel  Result Value Ref Range   Sodium 139 135 - 145 mmol/L   Potassium 4.4 3.5 - 5.1 mmol/L   Chloride 109 98 - 111 mmol/L   CO2 24 22 - 32 mmol/L   Glucose, Bld 94 70 - 99 mg/dL   BUN 14 6 - 20 mg/dL   Creatinine, Ser 9.97 0.44 - 1.00 mg/dL   Calcium 9.2 8.9 - 74.1 mg/dL   Total Protein 7.4 6.5 - 8.1 g/dL   Albumin 4.1 3.5 - 5.0 g/dL   AST 20 15 - 41 U/L   ALT 20 0 - 44 U/L   Alkaline Phosphatase 87 38 - 126 U/L   Total Bilirubin 0.2 (L) 0.3 - 1.2 mg/dL   GFR calc non Af Amer >60 >60 mL/min   GFR calc Af Amer >60 >60 mL/min  Anion gap 6 5 - 15      Assessment & Plan:    Encounter Diagnoses  Name Primary?  . Gastroesophageal reflux disease, esophagitis presence not specified Yes  . Hyperlipidemia, unspecified hyperlipidemia type   . Mammogram declined      -Reviewed labs with pt -Will increase atorvastatin -pt to watch lowfat diet -will Continue omeprazole.  Will refer to GI if not improved at next OV -declines mammogram -follow up 3 months

## 2019-01-27 NOTE — Patient Instructions (Signed)
Food Choices for Gastroesophageal Reflux Disease, Adult  When you have gastroesophageal reflux disease (GERD), the foods you eat and your eating habits are very important. Choosing the right foods can help ease the discomfort of GERD. Consider working with a diet and nutrition specialist (dietitian) to help you make healthy food choices.  What general guidelines should I follow?    Eating plan  · Choose healthy foods low in fat, such as fruits, vegetables, whole grains, low-fat dairy products, and lean meat, fish, and poultry.  · Eat frequent, small meals instead of three large meals each day. Eat your meals slowly, in a relaxed setting. Avoid bending over or lying down until 2-3 hours after eating.  · Limit high-fat foods such as fatty meats or fried foods.  · Limit your intake of oils, butter, and shortening to less than 8 teaspoons each day.  · Avoid the following:  ? Foods that cause symptoms. These may be different for different people. Keep a food diary to keep track of foods that cause symptoms.  ? Alcohol.  ? Drinking large amounts of liquid with meals.  ? Eating meals during the 2-3 hours before bed.  · Cook foods using methods other than frying. This may include baking, grilling, or broiling.  Lifestyle  · Maintain a healthy weight. Ask your health care provider what weight is healthy for you. If you need to lose weight, work with your health care provider to do so safely.  · Exercise for at least 30 minutes on 5 or more days each week, or as told by your health care provider.  · Avoid wearing clothes that fit tightly around your waist and chest.  · Do not use any products that contain nicotine or tobacco, such as cigarettes and e-cigarettes. If you need help quitting, ask your health care provider.  · Sleep with the head of your bed raised. Use a wedge under the mattress or blocks under the bed frame to raise the head of the bed.  What foods are not recommended?  The items listed may not be a complete  list. Talk with your dietitian about what dietary choices are best for you.  Grains  Pastries or quick breads with added fat. French toast.  Vegetables  Deep fried vegetables. French fries. Any vegetables prepared with added fat. Any vegetables that cause symptoms. For some people this may include tomatoes and tomato products, chili peppers, onions and garlic, and horseradish.  Fruits  Any fruits prepared with added fat. Any fruits that cause symptoms. For some people this may include citrus fruits, such as oranges, grapefruit, pineapple, and lemons.  Meats and other protein foods  High-fat meats, such as fatty beef or pork, hot dogs, ribs, ham, sausage, salami and bacon. Fried meat or protein, including fried fish and fried chicken. Nuts and nut butters.  Dairy  Whole milk and chocolate milk. Sour cream. Cream. Ice cream. Cream cheese. Milk shakes.  Beverages  Coffee and tea, with or without caffeine. Carbonated beverages. Sodas. Energy drinks. Fruit juice made with acidic fruits (such as orange or grapefruit). Tomato juice. Alcoholic drinks.  Fats and oils  Butter. Margarine. Shortening. Ghee.  Sweets and desserts  Chocolate and cocoa. Donuts.  Seasoning and other foods  Pepper. Peppermint and spearmint. Any condiments, herbs, or seasonings that cause symptoms. For some people, this may include curry, hot sauce, or vinegar-based salad dressings.  Summary  · When you have gastroesophageal reflux disease (GERD), food and lifestyle choices are very   important to help ease the discomfort of GERD.  · Eat frequent, small meals instead of three large meals each day. Eat your meals slowly, in a relaxed setting. Avoid bending over or lying down until 2-3 hours after eating.  · Limit high-fat foods such as fatty meat or fried foods.  This information is not intended to replace advice given to you by your health care provider. Make sure you discuss any questions you have with your health care provider.  Document Released:  10/30/2005 Document Revised: 10/31/2016 Document Reviewed: 10/31/2016  Elsevier Interactive Patient Education © 2019 [employer] Inc[employer]

## 2019-04-07 ENCOUNTER — Emergency Department (HOSPITAL_COMMUNITY): Payer: Self-pay

## 2019-04-07 ENCOUNTER — Emergency Department (HOSPITAL_COMMUNITY)
Admission: EM | Admit: 2019-04-07 | Discharge: 2019-04-07 | Disposition: A | Discharge status: Home / Self Care | Payer: Self-pay | Attending: Emergency Medicine | Admitting: Emergency Medicine

## 2019-04-07 ENCOUNTER — Other Ambulatory Visit: Payer: Self-pay

## 2019-04-07 ENCOUNTER — Encounter (HOSPITAL_COMMUNITY): Payer: Self-pay | Admitting: *Deleted

## 2019-04-07 DIAGNOSIS — I252 Old myocardial infarction: Secondary | ICD-10-CM | POA: Insufficient documentation

## 2019-04-07 DIAGNOSIS — F17290 Nicotine dependence, other tobacco product, uncomplicated: Secondary | ICD-10-CM | POA: Insufficient documentation

## 2019-04-07 DIAGNOSIS — Z79899 Other long term (current) drug therapy: Secondary | ICD-10-CM | POA: Insufficient documentation

## 2019-04-07 DIAGNOSIS — Y939 Activity, unspecified: Secondary | ICD-10-CM | POA: Insufficient documentation

## 2019-04-07 DIAGNOSIS — Y999 Unspecified external cause status: Secondary | ICD-10-CM | POA: Insufficient documentation

## 2019-04-07 DIAGNOSIS — J45909 Unspecified asthma, uncomplicated: Secondary | ICD-10-CM | POA: Insufficient documentation

## 2019-04-07 DIAGNOSIS — T148XXA Other injury of unspecified body region, initial encounter: Secondary | ICD-10-CM | POA: Insufficient documentation

## 2019-04-07 DIAGNOSIS — N390 Urinary tract infection, site not specified: Secondary | ICD-10-CM

## 2019-04-07 DIAGNOSIS — Y929 Unspecified place or not applicable: Secondary | ICD-10-CM | POA: Insufficient documentation

## 2019-04-07 DIAGNOSIS — X58XXXA Exposure to other specified factors, initial encounter: Secondary | ICD-10-CM | POA: Insufficient documentation

## 2019-04-07 LAB — URINALYSIS, ROUTINE W REFLEX MICROSCOPIC
Bilirubin Urine: NEGATIVE
Glucose, UA: NEGATIVE mg/dL
Ketones, ur: NEGATIVE mg/dL
Nitrite: POSITIVE — AB
Protein, ur: NEGATIVE mg/dL
Specific Gravity, Urine: 1.012 (ref 1.005–1.030)
WBC, UA: >50 WBC/hpf — ABNORMAL HIGH (ref 0–5)
pH: 6 (ref 5.0–8.0)

## 2019-04-07 MED ORDER — NITROFURANTOIN MONOHYD MACRO 100 MG PO CAPS: 100.0000 mg | ORAL_CAPSULE | Freq: Two times a day (BID) | ORAL | 0 refills | Status: DC

## 2019-04-07 MED ORDER — CYCLOBENZAPRINE HCL 10 MG PO TABS
10.0000 mg | ORAL_TABLET | Freq: Once | ORAL | Status: AC
  Administered 2019-04-07: 10 mg via ORAL
  Filled 2019-04-07: qty 1

## 2019-04-07 MED ORDER — ONDANSETRON HCL 4 MG PO TABS
4.0000 mg | ORAL_TABLET | Freq: Once | ORAL | Status: AC
  Administered 2019-04-07: 4 mg via ORAL
  Filled 2019-04-07: qty 1

## 2019-04-07 MED ORDER — SULFAMETHOXAZOLE-TRIMETHOPRIM 800-160 MG PO TABS
1.0000 | ORAL_TABLET | Freq: Once | ORAL | Status: AC
  Administered 2019-04-07: 1 via ORAL
  Filled 2019-04-07: qty 1

## 2019-04-07 MED ORDER — CYCLOBENZAPRINE HCL 10 MG PO TABS: 10.0000 mg | ORAL_TABLET | Freq: Three times a day (TID) | ORAL | 0 refills | Status: DC

## 2019-04-07 MED ORDER — TRAMADOL HCL 50 MG PO TABS
100.0000 mg | ORAL_TABLET | Freq: Once | ORAL | Status: AC
  Administered 2019-04-07: 100 mg via ORAL
  Filled 2019-04-07: qty 2

## 2019-04-07 NOTE — ED Triage Notes (Signed)
Pt with lower back pain that started yesterday, denies any injury or doing any activity that would cause pain.  Denies burning on urination. denies fever

## 2019-04-07 NOTE — Discharge Instructions (Signed)
Your examination is consistent with a muscle strain involving your lower back, your flank, and a portion of your groin.  Your urine test also suggest a urinary tract infection.  Please use Macrobid 2 times daily with food until all taken.  Please increase fluids.  A heating pad to your back and flank area will be helpful.  Use Flexeril 3 times daily.  Use 1000 mg of Tylenol with breakfast, lunch, dinner, and at bedtime.  Flexeril may cause drowsiness, and/or lightheadedness.  Please do not drive a vehicle, operate machinery, drink alcohol, or participate in activities requiring concentration when taking this medication.  Please see your primary physician in 7 to 10 days to have your urine rechecked to ensure that this infection has completely resolved.

## 2019-04-07 NOTE — ED Provider Notes (Signed)
[facility] EMERGENCY DEPARTMENT Provider Note   CSN: [id number] Arrival date & time: 04/07/19  1608    History   Chief Complaint Chief Complaint  Patient presents with  . Back Pain    HPI Jennifer Morton is a 58 y.o. female.     Patient is a 58 year old female who presents to the emergency department with a complaint of back and flank area pain.  The patient states that this problem started yesterday.  She does not recall a specific injury or incident.  She specifically denies having a fall of pain in an accident.  She states that she is active and does do some lifting and pulling.  She has not had any recent operations or procedures on the back or the flank.  At times the pain radiates into the right groin area.  The patient denies dysuria or fever or nausea/vomiting.  Patient has not noted any blood in the urine recently.  Last bowel movement was yesterday.  Patient presents now for assistance with this issue as she says the pain seems to be getting progressively worse.  The history is provided by the patient.    Past Medical History:  Diagnosis Date  . Asthma   . GERD (gastroesophageal reflux disease)   . Hyperlipidemia   . MI (myocardial infarction) [facility])     Patient Active Problem List   Diagnosis Date Noted  . Gastroesophageal reflux disease 01/27/2019  . Hyperlipidemia 01/27/2019  . 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 Cystocele 07/08/2015    Past Surgical History:  Procedure Laterality Date  . ABDOMINAL HYSTERECTOMY    . BLADDER SURGERY  2017   bladder tack  . CHOLECYSTECTOMY       OB History   No obstetric history on file.      Home Medications    Prior to Admission medications   Medication Sig Start Date End Date Taking? Authorizing Provider  atorvastatin (LIPITOR) 40 MG tablet Take 1 tablet (40 mg total) by mouth daily. 01/27/19   Jacquelin Hawking, PA-C  cetirizine (ZYRTEC) 10 MG tablet Take 10 mg by mouth daily.     [provider]  omeprazole (PRILOSEC) 40 MG capsule Take 1 capsule (40 mg total) by mouth daily. 10/22/18   Jacquelin Hawking, PA-C    Family History Family History  Problem Relation Age of Onset  . Heart disease Mother   . Hypertension Mother   . Heart failure Mother   . Hyperlipidemia Mother   . Cancer Father        colon cancer  . Diabetes Father   . COPD Father   . Hearing loss Daughter   . Cancer Maternal Uncle        lung cancer  . Cancer Maternal Uncle        liver cancer  . Cancer Maternal Uncle        back cancer    Social History Social History   Tobacco Use  . Smoking status: Never Smoker  . Smokeless tobacco: Current User    Types: Snuff  Substance Use Topics  . Alcohol use: No  . Drug use: No     Allergies   Penicillins and Prednisone   Review of Systems Review of Systems  Constitutional: Negative for activity change.       All ROS Neg except as noted in HPI  HENT: Negative.   Eyes: Negative for photophobia and discharge.  Respiratory: Negative for cough, shortness of breath and wheezing.  Cardiovascular: Negative for chest pain and palpitations.  Gastrointestinal: Negative for abdominal pain and blood in stool.  Genitourinary: Positive for flank pain. Negative for dysuria, frequency and hematuria.  Musculoskeletal: Positive for back pain. Negative for arthralgias and neck pain.  Skin: Negative.   Neurological: Negative for dizziness, seizures and speech difficulty.  Psychiatric/Behavioral: Negative for confusion and hallucinations.     Physical Exam Updated Vital Signs BP (!) 159/95 (BP Location: Right Arm)   Pulse 79   Temp 98.3 F (36.8 C) (Oral)   Resp 18   Ht  (1.626 m)   Wt 80.7 kg   SpO2 98%   BMI 30.55 kg/m   Physical Exam Vitals signs and nursing note reviewed.  Constitutional:      Appearance: She is well-developed. She is not toxic-appearing.  HENT:     Head: Normocephalic.     Right Ear: Tympanic  membrane and external ear normal.     Left Ear: Tympanic membrane and external ear normal.  Eyes:     General: Lids are normal.     Pupils: Pupils are equal, round, and reactive to light.  Neck:     Musculoskeletal: Normal range of motion and neck supple.     Vascular: No carotid bruit.  Cardiovascular:     Rate and Rhythm: Normal rate and regular rhythm.     Pulses: Normal pulses.     Heart sounds: Normal heart sounds.  Pulmonary:     Effort: No respiratory distress.     Breath sounds: Normal breath sounds.  Abdominal:     General: Bowel sounds are normal.     Palpations: Abdomen is soft.     Tenderness: There is no abdominal tenderness. There is no guarding.  Musculoskeletal: Normal range of motion.     Right hip: She exhibits tenderness. She exhibits no deformity.     Lumbar back: She exhibits pain and spasm.       Back:       Legs:  Lymphadenopathy:     Head:     Right side of head: No submandibular adenopathy.     Left side of head: No submandibular adenopathy.     Cervical: No cervical adenopathy.  Skin:    General: Skin is warm and dry.  Neurological:     Mental Status: She is alert and oriented to person, place, and time.     Cranial Nerves: No cranial nerve deficit.     Sensory: No sensory deficit.  Psychiatric:        Speech: Speech normal.      ED Treatments / Results  Labs (all labs ordered are listed, but only abnormal results are displayed) Labs Reviewed - No data to display  EKG None  Radiology No results found.  Procedures Procedures (including critical care time)  Medications Ordered in ED Medications - No data to display   Initial Impression / Assessment and Plan / ED Course  I have reviewed the triage vital signs and the nursing notes.  Pertinent labs & imaging results that were available during my care of the patient were reviewed by me and considered in my medical decision making (see chart for details).          Final  Clinical Impressions(s) / ED Diagnoses MDM  Vital signs within normal limits.  Pulse oximetry is 98% on room air.  Within normal limits by my interpretation.  Pain is reproduced by having the patient adduct the right hip and extend the  hip.  Will obtain Xray of the hip and pelvis and UA  A culture of the urine is been sent to the lab.  The urine analysis shows a cloudy yellow specimen with a small amount of blood on the dipstick.  Nitrates are positive.  Leukocyte esterase is large.  There are 11-20 red blood cells, and greater than 50 Horn blood cells per high-powered field.  X-ray of the right hip and pelvis is negative for fracture or dislocation or other bony abnormality.  Patient will be treated for muscle strain and urinary tract infection.  I discussed the findings with the patient in terms of which he understands.  I discussed the treatment plan including increasing fluids, Macrobid twice daily, and Flexeril 3 times daily.  Patient will use Tylenol with each meal and at bedtime.  Patient is to follow-up with the primary physician or return to the emergency department if any worsening of condition, changes of symptoms, problems or concerns.   Final diagnoses:  Muscle strain  Lower urinary tract infectious disease    ED Discharge Orders         Ordered    cyclobenzaprine (FLEXERIL) 10 MG tablet  3 times daily     04/07/19 1825    nitrofurantoin, macrocrystal-monohydrate, (MACROBID) 100 MG capsule  2 times daily     04/07/19 1825           Ivery QualeBryant, Montrey Buist, PA-C 04/07/19 1839    Samuel JesterMcManus, Kathleen, DO 04/11/19 [phone]

## 2019-04-10 LAB — URINE CULTURE
Culture: >=100000 — AB
Special Requests: NORMAL

## 2019-04-11 ENCOUNTER — Telehealth: Payer: Self-pay

## 2019-04-11 NOTE — Telephone Encounter (Signed)
Post ED Visit - Positive Culture Follow-up  Culture report reviewed by antimicrobial stewardship pharmacist: [facility] []  Enzo Bi, Pharm.D. []  Celedonio Miyamoto, Pharm.D., BCPS AQ-ID []  Garvin Fila, Pharm.D., BCPS []  Georgina Pillion, Pharm.D., BCPS []  [place], [address].D., BCPS, AAHIVP []  Estella Husk, Pharm.D., BCPS, AAHIVP []  Lysle Pearl, PharmD, BCPS []  Phillips Climes, PharmD, BCPS [x]  Agapito Games, PharmD, BCPS []  Verlan Friends, PharmD []  Mervyn Gay, PharmD, BCPS []  Vinnie Level, PharmD  [facility] []  Len Childs, PharmD []  Greer Pickerel, PharmD []  Adalberto Cole, PharmD []  Perlie Gold, Rph []  Lonell Face) Jean Rosenthal, PharmD []  Earl Many, PharmD []  Junita Push, PharmD []  Dorna Leitz, PharmD []  Terrilee Files, PharmD []  Lynann Beaver, PharmD []  Keturah Barre, PharmD []  Loralee Pacas, PharmD []  Bernadene Person, PharmD   Positive urine culture Treated with Macrobid, organism sensitive to the same and no further patient follow-up is required at this time.  Jerry Caras 04/11/2019, 10:06 AM

## 2019-04-23 ENCOUNTER — Other Ambulatory Visit (HOSPITAL_COMMUNITY)
Admission: RE | Admit: 2019-04-23 | Discharge: 2019-04-23 | Disposition: A | Discharge status: Home / Self Care | Payer: Self-pay | Source: Ambulatory Visit | Attending: Physician Assistant | Admitting: Physician Assistant

## 2019-04-23 DIAGNOSIS — E785 Hyperlipidemia, unspecified: Secondary | ICD-10-CM | POA: Insufficient documentation

## 2019-04-23 LAB — COMPREHENSIVE METABOLIC PANEL
ALT: 21 U/L (ref 0–44)
AST: 22 U/L (ref 15–41)
Albumin: 4 g/dL (ref 3.5–5.0)
Alkaline Phosphatase: 114 U/L (ref 38–126)
Anion gap: 10 (ref 5–15)
BUN: 14 mg/dL (ref 6–20)
CO2: 24 mmol/L (ref 22–32)
Calcium: 8.9 mg/dL (ref 8.9–10.3)
Chloride: 107 mmol/L (ref 98–111)
Creatinine, Ser: 0.82 mg/dL (ref 0.44–1.00)
GFR calc Af Amer: >60 mL/min (ref >60)
GFR calc non Af Amer: >60 mL/min (ref >60)
Glucose, Bld: 92 mg/dL (ref 70–99)
Potassium: 4.3 mmol/L (ref 3.5–5.1)
Sodium: 141 mmol/L (ref 135–145)
Total Bilirubin: 1.4 mg/dL — ABNORMAL HIGH (ref 0.3–1.2)
Total Protein: 7.6 g/dL (ref 6.5–8.1)

## 2019-04-23 LAB — LIPID PANEL
Cholesterol: 202 mg/dL — ABNORMAL HIGH (ref 0–200)
HDL: 58 mg/dL (ref >40)
LDL Cholesterol: 115 mg/dL — ABNORMAL HIGH (ref 0–99)
Total CHOL/HDL Ratio: 3.5 RATIO
Triglycerides: 146 mg/dL (ref <150)
VLDL: 29 mg/dL (ref 0–40)

## 2019-04-28 ENCOUNTER — Encounter: Payer: Self-pay | Admitting: Physician Assistant

## 2019-04-28 ENCOUNTER — Ambulatory Visit: Payer: Self-pay | Admitting: Physician Assistant

## 2019-04-28 DIAGNOSIS — E785 Hyperlipidemia, unspecified: Secondary | ICD-10-CM

## 2019-04-28 DIAGNOSIS — K219 Gastro-esophageal reflux disease without esophagitis: Secondary | ICD-10-CM

## 2019-04-28 DIAGNOSIS — Z5320 Procedure and treatment not carried out because of patient's decision for unspecified reasons: Secondary | ICD-10-CM | POA: Insufficient documentation

## 2019-04-28 NOTE — Progress Notes (Signed)
There were no vitals taken for this visit.   Subjective:    Patient ID: Jennifer Morton, female    DOB: 10/05/1961, 58 y.o.   MRN: [id number]  HPI: Jennifer Morton is a 58 y.o. female presenting on 04/28/2019 for No chief complaint on file.   HPI  This is a telemedicine visit due to coronavirus pandemic.  It is via Telephone as pt does not have a mobile phone with video capabilities  I connected with  Jennifer Morton on 04/28/19 by a video enabled telemedicine application and verified that I am speaking with the correct person using two identifiers.   I discussed the limitations of evaluation and management by telemedicine. The patient expressed understanding and agreed to proceed.  Pt is at home.  Provider is at office/clinic  She is still taking omeprazole twice daily.  She says no symptoms if she takes it twice.    She says she is wearing a mask when she goes to the store but her husband says they neither one ever wear a mask.     Relevant past medical, surgical, family and social history reviewed and updated as indicated. Interim medical history since our last visit reviewed. Allergies and medications reviewed and updated.   Current Outpatient Medications:  .  atorvastatin (LIPITOR) 40 MG tablet, Take 1 tablet (40 mg total) by mouth daily. (Patient taking differently: Take 40 mg by mouth every morning. ), Disp: 90 tablet, Rfl: 3 .  cetirizine (ZYRTEC) 10 MG tablet, Take 10 mg by mouth every morning. , Disp: , Rfl:  .  omeprazole (PRILOSEC) 40 MG capsule, Take 1 capsule (40 mg total) by mouth daily. (Patient taking differently: Take 40 mg by mouth every morning. ), Disp: 180 capsule, Rfl: 1    Review of Systems  Per HPI unless specifically indicated above     Objective:    There were no vitals taken for this visit.  Wt Readings from Last 3 Encounters:  04/07/19 178 lb (80.7 kg)  11/30/18 178 lb (80.7 kg)  10/22/18 177 lb (80.3 kg)    Physical Exam Pulmonary:   Effort: Pulmonary effort is normal. No respiratory distress.  Neurological:     Mental Status: She is alert and oriented to person, place, and time.  Psychiatric:        Speech: Speech normal.        Behavior: Behavior is cooperative.     Results for orders placed or performed during the hospital encounter of 04/23/19  Comprehensive metabolic panel  Result Value Ref Range   Sodium 141 135 - 145 mmol/L   Potassium 4.3 3.5 - 5.1 mmol/L   Chloride 107 98 - 111 mmol/L   CO2 24 22 - 32 mmol/L   Glucose, Bld 92 70 - 99 mg/dL   BUN 14 6 - 20 mg/dL   Creatinine, Ser 4.090.82 0.44 - 1.00 mg/dL   Calcium 8.9 8.9 - 81.110.3 mg/dL   Total Protein 7.6 6.5 - 8.1 g/dL   Albumin 4.0 3.5 - 5.0 g/dL   AST 22 15 - 41 U/L   ALT 21 0 - 44 U/L   Alkaline Phosphatase 114 38 - 126 U/L   Total Bilirubin 1.4 (H) 0.3 - 1.2 mg/dL   GFR calc non Af Amer >60 >60 mL/min   GFR calc Af Amer >60 >60 mL/min   Anion gap 10 5 - 15  Lipid panel  Result Value Ref Range   Cholesterol 202 (H) 0 -  200 mg/dL   Triglycerides 146 <150 mg/dL   HDL 58 >40 mg/dL   Total CHOL/HDL Ratio 3.5 RATIO   VLDL 29 0 - 40 mg/dL   LDL Cholesterol 115 (H) 0 - 99 mg/dL      Assessment & Plan:    Encounter Diagnoses  Name Primary?  . Gastroesophageal reflux disease, esophagitis presence not specified Yes  . Hyperlipidemia, unspecified hyperlipidemia type   . Screening mammography declined     -Reviewed labs with pt -screening Mammogram declined -Pt declined referral to GI for GERD.   -She is encouraged to wear a mask when she goes to store and avoid large gatherings -pt to follow up in 3 months.  She is to contact office sooner prn

## 2019-06-23 ENCOUNTER — Telehealth: Payer: Self-pay | Admitting: Student

## 2019-06-23 NOTE — Telephone Encounter (Addendum)
pt called requesting antibiotic for abcess tooth. pt c/o pain and swelling on her R upper tooth. Pt denies drainage. Pt states sx began yesterday, (06-22-19). Pt has taken naproxen for the pain which has helped some.  LPN explained to pt that PA is out of the office for the week and will return on Monday, 06-30-19. LPN explained to patient that PA will send staff message to PA and will contact her when she replies. Pt verbalized understanding.  LPN advises pt for the mean time to do warm salt water gargles and to alternate naproxen and IBU. Pt verbalized understanding.  Pt's preferred pharmacy is [facility]. Pt's preferred local pharmacy is [facility] in [place].

## 2019-06-24 ENCOUNTER — Other Ambulatory Visit: Payer: Self-pay | Admitting: Physician Assistant

## 2019-06-24 MED ORDER — CLINDAMYCIN HCL 300 MG PO CAPS: 300.0000 mg | ORAL_CAPSULE | Freq: Four times a day (QID) | ORAL | 0 refills | Status: AC

## 2019-06-24 NOTE — Telephone Encounter (Signed)
Pt returned call and was notified that rx clindamycin was sent to [facility] and that she has been placed on the dental list. Pt verbalized understanding.

## 2019-06-24 NOTE — Telephone Encounter (Signed)
Soyla Dryer, PA-C  [place], LPN  Sent today 0-21-11      Please notify Kellee Uphoff that rx clindamycin sent to [facility] and document in her chart. I could not get the reply function to work on the message you sent. Thank you   LPN called pt but was unable to leave vm as vm box is not set up. Will call again at a later time.  Pt was placed on the dental list.

## 2019-07-30 ENCOUNTER — Other Ambulatory Visit (HOSPITAL_COMMUNITY)
Admission: RE | Admit: 2019-07-30 | Discharge: 2019-07-30 | Disposition: A | Discharge status: Home / Self Care | Payer: Self-pay | Source: Ambulatory Visit | Attending: Physician Assistant | Admitting: Physician Assistant

## 2019-07-30 DIAGNOSIS — E785 Hyperlipidemia, unspecified: Secondary | ICD-10-CM | POA: Insufficient documentation

## 2019-07-30 LAB — COMPREHENSIVE METABOLIC PANEL
ALT: 20 U/L (ref 0–44)
AST: 19 U/L (ref 15–41)
Albumin: 4.2 g/dL (ref 3.5–5.0)
Alkaline Phosphatase: 106 U/L (ref 38–126)
Anion gap: 9 (ref 5–15)
BUN: 12 mg/dL (ref 6–20)
CO2: 24 mmol/L (ref 22–32)
Calcium: 9.3 mg/dL (ref 8.9–10.3)
Chloride: 108 mmol/L (ref 98–111)
Creatinine, Ser: 0.68 mg/dL (ref 0.44–1.00)
GFR calc Af Amer: >60 mL/min (ref >60)
GFR calc non Af Amer: >60 mL/min (ref >60)
Glucose, Bld: 86 mg/dL (ref 70–99)
Potassium: 4.2 mmol/L (ref 3.5–5.1)
Sodium: 141 mmol/L (ref 135–145)
Total Bilirubin: 0.7 mg/dL (ref 0.3–1.2)
Total Protein: 7.6 g/dL (ref 6.5–8.1)

## 2019-07-30 LAB — LIPID PANEL
Cholesterol: 216 mg/dL — ABNORMAL HIGH (ref 0–200)
HDL: 55 mg/dL (ref >40)
LDL Cholesterol: 120 mg/dL — ABNORMAL HIGH (ref 0–99)
Total CHOL/HDL Ratio: 3.9 RATIO
Triglycerides: 203 mg/dL — ABNORMAL HIGH (ref <150)
VLDL: 41 mg/dL — ABNORMAL HIGH (ref 0–40)

## 2019-08-04 ENCOUNTER — Ambulatory Visit: Payer: Self-pay | Admitting: Physician Assistant

## 2019-08-07 ENCOUNTER — Ambulatory Visit: Payer: Self-pay | Admitting: Physician Assistant

## 2019-08-07 ENCOUNTER — Encounter: Payer: Self-pay | Admitting: Physician Assistant

## 2019-08-07 DIAGNOSIS — Z5320 Procedure and treatment not carried out because of patient's decision for unspecified reasons: Secondary | ICD-10-CM

## 2019-08-07 DIAGNOSIS — K219 Gastro-esophageal reflux disease without esophagitis: Secondary | ICD-10-CM

## 2019-08-07 DIAGNOSIS — E785 Hyperlipidemia, unspecified: Secondary | ICD-10-CM

## 2019-08-07 DIAGNOSIS — M25551 Pain in right hip: Secondary | ICD-10-CM

## 2019-08-07 MED ORDER — OMEPRAZOLE 40 MG PO CPDR: 40.0000 mg | DELAYED_RELEASE_CAPSULE | Freq: Every day | ORAL | 1 refills | Status: DC

## 2019-08-07 MED ORDER — DICLOFENAC SODIUM 75 MG PO TBEC: 75.0000 mg | DELAYED_RELEASE_TABLET | Freq: Two times a day (BID) | ORAL | 0 refills | Status: DC

## 2019-08-07 MED ORDER — ATORVASTATIN CALCIUM 40 MG PO TABS: 40.0000 mg | ORAL_TABLET | Freq: Every day | ORAL | 1 refills | Status: DC

## 2019-08-07 NOTE — Progress Notes (Signed)
There were no vitals taken for this visit.   Subjective:    Patient ID: Jennifer Morton, female    DOB: 04/13/1961, 58 y.o.   MRN: [id number]  HPI: Jennifer Morton is a 58 y.o. female presenting on 08/07/2019 for No chief complaint on file.   HPI   This is a telemedicine appointment due to coronavirus pandemic.  It is via Telephone as pt does not have a video enabled device.  I connected with  Jennifer Morton on 08/07/19  by a video enabled telemedicine application and verified that I am speaking with the correct person using two identifiers.   I discussed the limitations of evaluation and management by telemedicine. The patient expressed understanding and agreed to proceed.  Pt is at home.  Provider is at office.    Pt states a lot of pain in both hips.  Past several days it hurts all the time.  Pain goes down into the legs.  The hips hurting mostly with walking prior to the past several days.   No recent injury.   She has tried ice and heat without improvement.   She is using APAP which doesn't provide much relief either.  She got xray in May that was normal   Relevant past medical, surgical, family and social history reviewed and updated as indicated. Interim medical history since our last visit reviewed. Allergies and medications reviewed and updated.    Current Outpatient Medications:  .  atorvastatin (LIPITOR) 40 MG tablet, Take 1 tablet (40 mg total) by mouth daily. (Patient taking differently: Take 40 mg by mouth every morning. ), Disp: 90 tablet, Rfl: 3 .  cetirizine (ZYRTEC) 10 MG tablet, Take 10 mg by mouth every morning. , Disp: , Rfl:  .  omeprazole (PRILOSEC) 40 MG capsule, Take 1 capsule (40 mg total) by mouth daily. (Patient taking differently: Take 40 mg by mouth every morning. ), Disp: 180 capsule, Rfl: 1   Review of Systems  Per HPI unless specifically indicated above     Objective:    There were no vitals taken for this visit.  Wt Readings from Last 3  Encounters:  04/07/19 178 lb (80.7 kg)  11/30/18 178 lb (80.7 kg)  10/22/18 177 lb (80.3 kg)    Physical Exam Pulmonary:     Effort: No respiratory distress.  Neurological:     Mental Status: She is alert and oriented to person, place, and time.  Psychiatric:        Attention and Perception: Attention normal.        Speech: Speech normal.        Behavior: Behavior is cooperative.     Results for orders placed or performed during the hospital encounter of 07/30/19  Lipid panel  Result Value Ref Range   Cholesterol 216 (H) 0 - 200 mg/dL   Triglycerides 203 (H) <150 mg/dL   HDL 55 >40 mg/dL   Total CHOL/HDL Ratio 3.9 RATIO   VLDL 41 (H) 0 - 40 mg/dL   LDL Cholesterol 120 (H) 0 - 99 mg/dL  Comprehensive metabolic panel  Result Value Ref Range   Sodium 141 135 - 145 mmol/L   Potassium 4.2 3.5 - 5.1 mmol/L   Chloride 108 98 - 111 mmol/L   CO2 24 22 - 32 mmol/L   Glucose, Bld 86 70 - 99 mg/dL   BUN 12 6 - 20 mg/dL   Creatinine, Ser 0.68 0.44 - 1.00 mg/dL   Calcium 9.3 8.9 -  10.3 mg/dL   Total Protein 7.6 6.5 - 8.1 g/dL   Albumin 4.2 3.5 - 5.0 g/dL   AST 19 15 - 41 U/L   ALT 20 0 - 44 U/L   Alkaline Phosphatase 106 38 - 126 U/L   Total Bilirubin 0.7 0.3 - 1.2 mg/dL   GFR calc non Af Amer >60 >60 mL/min   GFR calc Af Amer >60 >60 mL/min   Anion gap 9 5 - 15      Assessment & Plan:    Encounter Diagnoses  Name Primary?  . Hyperlipidemia, unspecified hyperlipidemia type Yes  . Pain of both hip joints   . Gastroesophageal reflux disease, esophagitis presence not specified   . Screening mammography declined      -Reviewed labs with pt .  Counseled on lowfat diet.  Will monitor lipids and consider medication change at next appointment if not improved -Trial of diclofenac -  Will refer to orthopedist if pain in hips persists -pt Declined screening mammogram -pt to follow up 16months.  She is to contact office sooner prn

## 2019-08-11 ENCOUNTER — Ambulatory Visit: Payer: Self-pay | Admitting: Physician Assistant

## 2019-09-22 ENCOUNTER — Telehealth: Payer: Self-pay | Admitting: Student

## 2019-09-22 ENCOUNTER — Other Ambulatory Visit: Payer: Self-pay | Admitting: Physician Assistant

## 2019-09-22 MED ORDER — CLINDAMYCIN HCL 300 MG PO CAPS: 300.0000 mg | ORAL_CAPSULE | Freq: Three times a day (TID) | ORAL | 0 refills | Status: DC

## 2019-09-22 NOTE — Telephone Encounter (Signed)
Pt called c/o abscess tooth on top right that has been hurting for about a week and has caused her face to swell.  Pt states she went to [facility] for this last week but was not given anything for it as she told them she still had 5 clindamycins left over from 06-24-19 rx that was rx by PA. Pt states at that time she only took it until she felt better as to why she has 5 left over. Pt states she took one of her left over clindamycins this morning.  LPN explained to pt that antibiotics are meant to take until completed and not only until she is feeling better. Pt verbalized understanding.   LPN explained to pt that PA will rx antibiotic and send to her preferred pharmacy. Pt requests for rx to be sent to [facility] and LPN reminds her to make sure to take until she completely out of her med. Pt verbalized understanding.

## 2019-11-26 ENCOUNTER — Other Ambulatory Visit (HOSPITAL_COMMUNITY)
Admission: RE | Admit: 2019-11-26 | Discharge: 2019-11-26 | Disposition: A | Discharge status: Home / Self Care | Payer: Self-pay | Source: Ambulatory Visit | Attending: Physician Assistant | Admitting: Physician Assistant

## 2019-11-26 DIAGNOSIS — E785 Hyperlipidemia, unspecified: Secondary | ICD-10-CM | POA: Insufficient documentation

## 2019-11-26 LAB — LIPID PANEL
Cholesterol: 242 mg/dL — ABNORMAL HIGH (ref 0–200)
HDL: 64 mg/dL (ref >40)
LDL Cholesterol: 144 mg/dL — ABNORMAL HIGH (ref 0–99)
Total CHOL/HDL Ratio: 3.8 RATIO
Triglycerides: 169 mg/dL — ABNORMAL HIGH (ref <150)
VLDL: 34 mg/dL (ref 0–40)

## 2019-11-26 LAB — COMPREHENSIVE METABOLIC PANEL
ALT: 19 U/L (ref 0–44)
AST: 18 U/L (ref 15–41)
Albumin: 4.3 g/dL (ref 3.5–5.0)
Alkaline Phosphatase: 113 U/L (ref 38–126)
Anion gap: 10 (ref 5–15)
BUN: 11 mg/dL (ref 6–20)
CO2: 24 mmol/L (ref 22–32)
Calcium: 8.9 mg/dL (ref 8.9–10.3)
Chloride: 104 mmol/L (ref 98–111)
Creatinine, Ser: 0.75 mg/dL (ref 0.44–1.00)
GFR calc Af Amer: >60 mL/min (ref >60)
GFR calc non Af Amer: >60 mL/min (ref >60)
Glucose, Bld: 94 mg/dL (ref 70–99)
Potassium: 3.8 mmol/L (ref 3.5–5.1)
Sodium: 138 mmol/L (ref 135–145)
Total Bilirubin: 1.1 mg/dL (ref 0.3–1.2)
Total Protein: 7.8 g/dL (ref 6.5–8.1)

## 2019-12-02 ENCOUNTER — Encounter: Payer: Self-pay | Admitting: Physician Assistant

## 2019-12-02 ENCOUNTER — Ambulatory Visit: Payer: Self-pay | Admitting: Physician Assistant

## 2019-12-02 DIAGNOSIS — M25551 Pain in right hip: Secondary | ICD-10-CM

## 2019-12-02 DIAGNOSIS — Z5320 Procedure and treatment not carried out because of patient's decision for unspecified reasons: Secondary | ICD-10-CM

## 2019-12-02 DIAGNOSIS — E785 Hyperlipidemia, unspecified: Secondary | ICD-10-CM

## 2019-12-02 MED ORDER — DICLOFENAC SODIUM 75 MG PO TBEC: 75.0000 mg | DELAYED_RELEASE_TABLET | Freq: Two times a day (BID) | ORAL | 1 refills | Status: DC | PRN

## 2019-12-02 NOTE — Progress Notes (Signed)
There were no vitals taken for this visit.   Subjective:    Patient ID: Jennifer Morton, female    DOB: Dec 09, 1960, 59 y.o.   MRN: [id number]  HPI: Jennifer Morton is a 59 y.o. female presenting on 12/02/2019 for No chief complaint on file.   HPI    This is a telemedicine appointment due to coronavirus pandemic.  It is via Telephone as pt does not have a video enabled device.    Pt was scheduled to [facility] into the office for in-person visit but called office yesterday requesting change to virtual appointment.    I connected with  Jennifer Morton on 12/02/19 by a video enabled telemedicine application and verified that I am speaking with the correct person using two identifiers.   I discussed the limitations of evaluation and management by telemedicine. The patient expressed understanding and agreed to proceed.  Pt is at home.  Provider is at office   Pt is 58yoF with dyslipidemia and GERD.  She says she is Doing okay except her hips hurt.  She says it Bothers her every day.  It is bilateral.  She had normal xray may 2020.  She took some diclofenac that helped but she ran out of it.    She tried otc apap and ibu and those didn't help.   She has no other complaints today.     Relevant past medical, surgical, family and social history reviewed and updated as indicated. Interim medical history since our last visit reviewed. Allergies and medications reviewed and updated.   Current Outpatient Medications:  .  atorvastatin (LIPITOR) 40 MG tablet, Take 1 tablet (40 mg total) by mouth daily., Disp: 90 tablet, Rfl: 1 .  cetirizine (ZYRTEC) 10 MG tablet, Take 10 mg by mouth every morning. , Disp: , Rfl:  .  omeprazole (PRILOSEC) 40 MG capsule, Take 1 capsule (40 mg total) by mouth daily., Disp: 180 capsule, Rfl: 1    Review of Systems  Per HPI unless specifically indicated above     Objective:    There were no vitals taken for this visit.  Wt Readings from Last 3 Encounters:  04/07/19 178  lb (80.7 kg)  11/30/18 178 lb (80.7 kg)  10/22/18 177 lb (80.3 kg)    Physical Exam Pulmonary:     Effort: Pulmonary effort is normal. No respiratory distress.  Neurological:     Mental Status: She is alert and oriented to person, place, and time.  Psychiatric:        Attention and Perception: Attention normal.        Speech: Speech normal.        Behavior: Behavior is cooperative.     Results for orders placed or performed during the hospital encounter of 11/26/19  Lipid panel  Result Value Ref Range   Cholesterol 242 (H) 0 - 200 mg/dL   Triglycerides 093 (H) <150 mg/dL   HDL 64 >81 mg/dL   Total CHOL/HDL Ratio 3.8 RATIO   VLDL 34 0 - 40 mg/dL   LDL Cholesterol 829 (H) 0 - 99 mg/dL  Comprehensive metabolic panel  Result Value Ref Range   Sodium 138 135 - 145 mmol/L   Potassium 3.8 3.5 - 5.1 mmol/L   Chloride 104 98 - 111 mmol/L   CO2 24 22 - 32 mmol/L   Glucose, Bld 94 70 - 99 mg/dL   BUN 11 6 - 20 mg/dL   Creatinine, Ser 9.37 0.44 - 1.00 mg/dL  Calcium 8.9 8.9 - 10.3 mg/dL   Total Protein 7.8 6.5 - 8.1 g/dL   Albumin 4.3 3.5 - 5.0 g/dL   AST 18 15 - 41 U/L   ALT 19 0 - 44 U/L   Alkaline Phosphatase 113 38 - 126 U/L   Total Bilirubin 1.1 0.3 - 1.2 mg/dL   GFR calc non Af Amer >60 >60 mL/min   GFR calc Af Amer >60 >60 mL/min   Anion gap 10 5 - 15      Assessment & Plan:    Encounter Diagnoses  Name Primary?  . Hyperlipidemia, unspecified hyperlipidemia type Yes  . Pain of both hip joints   . Screening mammography declined      reviewed labs with pt.  Counseled pt to Watch lowfat diet.   She will continue current atorvastatin but If lipids still high at next check will increase lipitor  Pt Declined screening mammogram  Refilled diclofenac for her to use prn hip pain.  She May need orthopedic referral at next appt if not improving.  She is encouraged to take this med with food  She will follow up in 3 months.  She is to contact office sooner prn

## 2020-02-24 ENCOUNTER — Other Ambulatory Visit (HOSPITAL_COMMUNITY)
Admission: RE | Admit: 2020-02-24 | Discharge: 2020-02-24 | Disposition: A | Discharge status: Home / Self Care | Payer: Self-pay | Source: Ambulatory Visit | Attending: Physician Assistant | Admitting: Physician Assistant

## 2020-02-24 DIAGNOSIS — E785 Hyperlipidemia, unspecified: Secondary | ICD-10-CM | POA: Insufficient documentation

## 2020-02-24 LAB — COMPREHENSIVE METABOLIC PANEL
ALT: 24 U/L (ref 0–44)
AST: 22 U/L (ref 15–41)
Albumin: 4.1 g/dL (ref 3.5–5.0)
Alkaline Phosphatase: 109 U/L (ref 38–126)
Anion gap: 10 (ref 5–15)
BUN: 14 mg/dL (ref 8–23)
CO2: 25 mmol/L (ref 22–32)
Calcium: 8.9 mg/dL (ref 8.9–10.3)
Chloride: 103 mmol/L (ref 98–111)
Creatinine, Ser: 0.75 mg/dL (ref 0.44–1.00)
GFR calc Af Amer: >60 mL/min (ref >60)
GFR calc non Af Amer: >60 mL/min (ref >60)
Glucose, Bld: 93 mg/dL (ref 70–99)
Potassium: 4 mmol/L (ref 3.5–5.1)
Sodium: 138 mmol/L (ref 135–145)
Total Bilirubin: 0.8 mg/dL (ref 0.3–1.2)
Total Protein: 7.2 g/dL (ref 6.5–8.1)

## 2020-02-24 LAB — LIPID PANEL
Cholesterol: 216 mg/dL — ABNORMAL HIGH (ref 0–200)
HDL: 57 mg/dL (ref >40)
LDL Cholesterol: 125 mg/dL — ABNORMAL HIGH (ref 0–99)
Total CHOL/HDL Ratio: 3.8 RATIO
Triglycerides: 170 mg/dL — ABNORMAL HIGH (ref <150)
VLDL: 34 mg/dL (ref 0–40)

## 2020-03-01 ENCOUNTER — Encounter: Payer: Self-pay | Admitting: Physician Assistant

## 2020-03-01 ENCOUNTER — Other Ambulatory Visit: Payer: Self-pay

## 2020-03-01 ENCOUNTER — Ambulatory Visit: Payer: Self-pay | Admitting: Physician Assistant

## 2020-03-01 VITALS — BP 116/76 | HR 85 | Temp 98.1°F | Wt 182.7 lb

## 2020-03-01 DIAGNOSIS — M25551 Pain in right hip: Secondary | ICD-10-CM

## 2020-03-01 DIAGNOSIS — Z5320 Procedure and treatment not carried out because of patient's decision for unspecified reasons: Secondary | ICD-10-CM

## 2020-03-01 DIAGNOSIS — Z1211 Encounter for screening for malignant neoplasm of colon: Secondary | ICD-10-CM

## 2020-03-01 DIAGNOSIS — E785 Hyperlipidemia, unspecified: Secondary | ICD-10-CM

## 2020-03-01 MED ORDER — CETIRIZINE HCL 10 MG PO TABS: 10.0000 mg | ORAL_TABLET | Freq: Every day | ORAL | 0 refills | Status: DC | PRN

## 2020-03-01 MED ORDER — OMEPRAZOLE 40 MG PO CPDR: 40.0000 mg | DELAYED_RELEASE_CAPSULE | Freq: Every day | ORAL | 1 refills | Status: DC

## 2020-03-01 MED ORDER — ATORVASTATIN CALCIUM 40 MG PO TABS: 40.0000 mg | ORAL_TABLET | Freq: Every day | ORAL | 1 refills | Status: DC

## 2020-03-01 NOTE — Progress Notes (Signed)
BP 116/76   Pulse 85   Temp 98.1 F (36.7 C)   Wt 182 lb 11.2 oz (82.9 kg)   SpO2 97%   BMI 31.36 kg/m    Subjective:    Patient ID: Jennifer Morton, female    DOB: 29-Oct-1961, 59 y.o.   MRN: [id number]  HPI: Jennifer Morton is a 59 y.o. female presenting on 03/01/2020 for Hyperlipidemia   HPI     Pt had a negative covid 19 screening questionnaire.    Pt is 59yoF with dyslipidemia and GERD.  Pt using diclofenac bid every day.  Says it helped at first but not now.  R hip pain is her complaint.   Pt says she got denied for [facility] in the past.    Relevant past medical, surgical, family and social history reviewed and updated as indicated. Interim medical history since our last visit reviewed. Allergies and medications reviewed and updated.    Current Outpatient Medications:  .  atorvastatin (LIPITOR) 40 MG tablet, Take 1 tablet (40 mg total) by mouth daily., Disp: 90 tablet, Rfl: 1 .  diclofenac (VOLTAREN) 75 MG EC tablet, Take 1 tablet (75 mg total) by mouth 2 (two) times daily as needed., Disp: 60 tablet, Rfl: 1 .  omeprazole (PRILOSEC) 40 MG capsule, Take 1 capsule (40 mg total) by mouth daily., Disp: 180 capsule, Rfl: 1 .  cetirizine (ZYRTEC) 10 MG tablet, Take 10 mg by mouth every morning. , Disp: , Rfl:     Review of Systems  Per HPI unless specifically indicated above     Objective:    BP 116/76   Pulse 85   Temp 98.1 F (36.7 C)   Wt 182 lb 11.2 oz (82.9 kg)   SpO2 97%   BMI 31.36 kg/m   Wt Readings from Last 3 Encounters:  03/01/20 182 lb 11.2 oz (82.9 kg)  04/07/19 178 lb (80.7 kg)  11/30/18 178 lb (80.7 kg)    Physical Exam Vitals reviewed.  Constitutional:      General: She is not in acute distress.    Appearance: She is well-developed. She is not ill-appearing.  HENT:     Head: Normocephalic and atraumatic.  Cardiovascular:     Rate and Rhythm: Normal rate and regular rhythm.  Pulmonary:     Effort: Pulmonary effort is normal.    Breath sounds: Normal breath sounds.  Abdominal:     General: Bowel sounds are normal.     Palpations: Abdomen is soft. There is no mass.     Tenderness: There is no abdominal tenderness.  Musculoskeletal:     Cervical back: Neck supple.     Right lower leg: No edema.     Left lower leg: No edema.  Lymphadenopathy:     Cervical: No cervical adenopathy.  Skin:    General: Skin is warm and dry.  Neurological:     Mental Status: She is alert and oriented to person, place, and time.  Psychiatric:        Attention and Perception: Attention normal.        Speech: Speech normal.        Behavior: Behavior normal. Behavior is cooperative.     Results for orders placed or performed during the hospital encounter of 02/24/20  Comprehensive metabolic panel  Result Value Ref Range   Sodium 138 135 - 145 mmol/L   Potassium 4.0 3.5 - 5.1 mmol/L   Chloride 103 98 - 111 mmol/L  CO2 25 22 - 32 mmol/L   Glucose, Bld 93 70 - 99 mg/dL   BUN 14 8 - 23 mg/dL   Creatinine, Ser 0.75 0.44 - 1.00 mg/dL   Calcium 8.9 8.9 - 10.3 mg/dL   Total Protein 7.2 6.5 - 8.1 g/dL   Albumin 4.1 3.5 - 5.0 g/dL   AST 22 15 - 41 U/L   ALT 24 0 - 44 U/L   Alkaline Phosphatase 109 38 - 126 U/L   Total Bilirubin 0.8 0.3 - 1.2 mg/dL   GFR calc non Af Amer >60 >60 mL/min   GFR calc Af Amer >60 >60 mL/min   Anion gap 10 5 - 15  Lipid panel  Result Value Ref Range   Cholesterol 216 (H) 0 - 200 mg/dL   Triglycerides 170 (H) <150 mg/dL   HDL 57 >40 mg/dL   Total CHOL/HDL Ratio 3.8 RATIO   VLDL 34 0 - 40 mg/dL   LDL Cholesterol 125 (H) 0 - 99 mg/dL      Assessment & Plan:   Encounter Diagnoses  Name Primary?  . Hyperlipidemia, unspecified hyperlipidemia type Yes  . Pain of both hip joints   . Screening for colon cancer   . Mammogram declined       -Reviewed labs with pt -pt was [place] ifobt for colon cancer screening -Pt Declined screening mammogram -pt declined orthopedics for her hip due to not being  able to get [facility] in the past. -gave Sample proair -pt to follow up  6 months.  She is to contact office sooner prn

## 2020-03-05 ENCOUNTER — Other Ambulatory Visit: Payer: Self-pay | Admitting: Physician Assistant

## 2020-03-05 DIAGNOSIS — Z1211 Encounter for screening for malignant neoplasm of colon: Secondary | ICD-10-CM

## 2020-06-15 ENCOUNTER — Other Ambulatory Visit: Payer: Self-pay | Admitting: Physician Assistant

## 2020-08-31 ENCOUNTER — Ambulatory Visit: Payer: Self-pay | Admitting: Physician Assistant

## 2020-09-20 ENCOUNTER — Other Ambulatory Visit (HOSPITAL_COMMUNITY)
Admission: RE | Admit: 2020-09-20 | Discharge: 2020-09-20 | Disposition: A | Discharge status: Home / Self Care | Payer: Self-pay | Source: Ambulatory Visit | Attending: Physician Assistant | Admitting: Physician Assistant

## 2020-09-20 ENCOUNTER — Other Ambulatory Visit: Payer: Self-pay

## 2020-09-20 DIAGNOSIS — E785 Hyperlipidemia, unspecified: Secondary | ICD-10-CM | POA: Insufficient documentation

## 2020-09-20 LAB — COMPREHENSIVE METABOLIC PANEL
ALT: 21 U/L (ref 0–44)
AST: 23 U/L (ref 15–41)
Albumin: 4.2 g/dL (ref 3.5–5.0)
Alkaline Phosphatase: 84 U/L (ref 38–126)
Anion gap: 9 (ref 5–15)
BUN: 13 mg/dL (ref 8–23)
CO2: 25 mmol/L (ref 22–32)
Calcium: 8.9 mg/dL (ref 8.9–10.3)
Chloride: 104 mmol/L (ref 98–111)
Creatinine, Ser: 0.74 mg/dL (ref 0.44–1.00)
GFR, Estimated: >60 mL/min (ref >60)
Glucose, Bld: 92 mg/dL (ref 70–99)
Potassium: 4.1 mmol/L (ref 3.5–5.1)
Sodium: 138 mmol/L (ref 135–145)
Total Bilirubin: 1 mg/dL (ref 0.3–1.2)
Total Protein: 7.2 g/dL (ref 6.5–8.1)

## 2020-09-20 LAB — LIPID PANEL
Cholesterol: 228 mg/dL — ABNORMAL HIGH (ref 0–200)
HDL: 62 mg/dL (ref >40)
LDL Cholesterol: 135 mg/dL — ABNORMAL HIGH (ref 0–99)
Total CHOL/HDL Ratio: 3.7 RATIO
Triglycerides: 156 mg/dL — ABNORMAL HIGH (ref <150)
VLDL: 31 mg/dL (ref 0–40)

## 2020-09-27 ENCOUNTER — Ambulatory Visit: Payer: Self-pay | Admitting: Physician Assistant

## 2020-09-27 ENCOUNTER — Encounter: Payer: Self-pay | Admitting: Physician Assistant

## 2020-09-27 VITALS — BP 130/98 | HR 66 | Temp 97.7°F | Ht 62.5 in | Wt 184.5 lb

## 2020-09-27 DIAGNOSIS — M25551 Pain in right hip: Secondary | ICD-10-CM

## 2020-09-27 DIAGNOSIS — E785 Hyperlipidemia, unspecified: Secondary | ICD-10-CM

## 2020-09-27 DIAGNOSIS — Z5320 Procedure and treatment not carried out because of patient's decision for unspecified reasons: Secondary | ICD-10-CM

## 2020-09-27 MED ORDER — ATORVASTATIN CALCIUM 80 MG PO TABS: 80.0000 mg | ORAL_TABLET | Freq: Every day | ORAL | 1 refills | Status: DC

## 2020-09-27 MED ORDER — OMEPRAZOLE 40 MG PO CPDR: 40.0000 mg | DELAYED_RELEASE_CAPSULE | Freq: Every day | ORAL | 1 refills | Status: DC

## 2020-09-27 NOTE — Progress Notes (Signed)
BP (!) 130/98    Pulse 66    Temp 97.7 F (36.5 C)    Ht 5' 2.5" (1.588 m)    Wt 184 lb 8 oz (83.7 kg)    SpO2 98%    BMI 33.21 kg/m    Subjective:    Patient ID: Jennifer Morton, female    DOB: 08-24-1961, 59 y.o.   MRN: [id number]  HPI: Jennifer Morton is a 59 y.o. female presenting on 09/27/2020 for Hyperlipidemia   HPI    Pt had a negative covid 19 screening questionnaire.    Chief Complaint  Patient presents with   Hyperlipidemia     Pt complains of Hip pain, bilaterally.    Diclofenac didn't help much.  She takes something over the counter she doesn't remember what it's called but it doesn't help either.  It doesn't interfere with her daily activities.     Relevant past medical, surgical, family and social history reviewed and updated as indicated. Interim medical history since our last visit reviewed. Allergies and medications reviewed and updated.    Current Outpatient Medications:    atorvastatin (LIPITOR) 40 MG tablet, Take 1 tablet (40 mg total) by mouth daily., Disp: 90 tablet, Rfl: 1   cetirizine (ZYRTEC) 10 MG tablet, TAKE 1 Tablet BY MOUTH ONCE EVERY DAY AS NEEDED FOR ALLERGIES, Disp: 90 tablet, Rfl: 0   omeprazole (PRILOSEC) 40 MG capsule, Take 1 capsule (40 mg total) by mouth daily., Disp: 180 capsule, Rfl: 1   diclofenac (VOLTAREN) 75 MG EC tablet, Take 1 tablet (75 mg total) by mouth 2 (two) times daily as needed. (Patient not taking: Reported on 09/27/2020), Disp: 60 tablet, Rfl: 1   Review of Systems  Per HPI unless specifically indicated above     Objective:    BP (!) 130/98    Pulse 66    Temp 97.7 F (36.5 C)    Ht 5' 2.5" (1.588 m)    Wt 184 lb 8 oz (83.7 kg)    SpO2 98%    BMI 33.21 kg/m   Wt Readings from Last 3 Encounters:  09/27/20 184 lb 8 oz (83.7 kg)  03/01/20 182 lb 11.2 oz (82.9 kg)  04/07/19 178 lb (80.7 kg)    Physical Exam Vitals reviewed.  Constitutional:      General: She is not in acute distress.    Appearance: She  is well-developed. She is not ill-appearing.  HENT:     Head: Normocephalic and atraumatic.  Cardiovascular:     Rate and Rhythm: Normal rate and regular rhythm.  Pulmonary:     Effort: Pulmonary effort is normal.     Breath sounds: Normal breath sounds.  Abdominal:     General: Bowel sounds are normal.     Palpations: Abdomen is soft. There is no mass.     Tenderness: There is no abdominal tenderness.  Musculoskeletal:     Cervical back: Neck supple.     Right hip: Normal.     Left hip: Normal.     Right lower leg: No edema.     Left lower leg: No edema.  Lymphadenopathy:     Cervical: No cervical adenopathy.  Skin:    General: Skin is warm and dry.  Neurological:     Mental Status: She is alert and oriented to person, place, and time.  Psychiatric:        Behavior: Behavior normal.     Results for orders placed or performed during  the hospital encounter of 09/20/20  Lipid panel  Result Value Ref Range   Cholesterol 228 (H) 0 - 200 mg/dL   Triglycerides 867 (H) <150 mg/dL   HDL 62 >61 mg/dL   Total CHOL/HDL Ratio 3.7 RATIO   VLDL 31 0 - 40 mg/dL   LDL Cholesterol 950 (H) 0 - 99 mg/dL  Comprehensive metabolic panel  Result Value Ref Range   Sodium 138 135 - 145 mmol/L   Potassium 4.1 3.5 - 5.1 mmol/L   Chloride 104 98 - 111 mmol/L   CO2 25 22 - 32 mmol/L   Glucose, Bld 92 70 - 99 mg/dL   BUN 13 8 - 23 mg/dL   Creatinine, Ser 9.32 0.44 - 1.00 mg/dL   Calcium 8.9 8.9 - 67.1 mg/dL   Total Protein 7.2 6.5 - 8.1 g/dL   Albumin 4.2 3.5 - 5.0 g/dL   AST 23 15 - 41 U/L   ALT 21 0 - 44 U/L   Alkaline Phosphatase 84 38 - 126 U/L   Total Bilirubin 1.0 0.3 - 1.2 mg/dL   GFR, Estimated >24 >58 mL/min   Anion gap 9 5 - 15      Assessment & Plan:    Encounter Diagnoses  Name Primary?   Hyperlipidemia, unspecified hyperlipidemia type Yes   Pain of both hip joints    Screening mammography declined     -reviewed labs with pt -Increase atorvastatin and watch  lowfat diet -pt Declined screening mammogram -pt reminded to Return ifobt given in April -discussed hips  R hip xray done last year normal.  Asked pt if it was bad enough yet for her to get injections and she says no.  Told her to let us know when it is and we would refer to orthopedics.  Until then, APAP or IBU prn.  Encouraged pt to avoid weight gain which would further stress the hips and increase pain -pt to follow up 3 months.  She is to contact office sooner prn

## 2020-09-28 ENCOUNTER — Other Ambulatory Visit: Payer: Self-pay | Admitting: Physician Assistant

## 2020-12-20 ENCOUNTER — Other Ambulatory Visit: Payer: Self-pay | Admitting: Physician Assistant

## 2020-12-29 ENCOUNTER — Other Ambulatory Visit: Payer: Self-pay | Admitting: Physician Assistant

## 2020-12-29 DIAGNOSIS — E785 Hyperlipidemia, unspecified: Secondary | ICD-10-CM

## 2021-01-04 ENCOUNTER — Other Ambulatory Visit: Payer: Self-pay

## 2021-01-04 ENCOUNTER — Other Ambulatory Visit (HOSPITAL_COMMUNITY)
Admission: RE | Admit: 2021-01-04 | Discharge: 2021-01-04 | Disposition: A | Discharge status: Home / Self Care | Payer: Self-pay | Source: Ambulatory Visit | Attending: Physician Assistant | Admitting: Physician Assistant

## 2021-01-04 DIAGNOSIS — E785 Hyperlipidemia, unspecified: Secondary | ICD-10-CM | POA: Insufficient documentation

## 2021-01-04 LAB — COMPREHENSIVE METABOLIC PANEL
ALT: 60 U/L — ABNORMAL HIGH (ref 0–44)
AST: 46 U/L — ABNORMAL HIGH (ref 15–41)
Albumin: 4.1 g/dL (ref 3.5–5.0)
Alkaline Phosphatase: 98 U/L (ref 38–126)
Anion gap: 8 (ref 5–15)
BUN: 13 mg/dL (ref 8–23)
CO2: 22 mmol/L (ref 22–32)
Calcium: 8.7 mg/dL — ABNORMAL LOW (ref 8.9–10.3)
Chloride: 104 mmol/L (ref 98–111)
Creatinine, Ser: 0.7 mg/dL (ref 0.44–1.00)
GFR, Estimated: >60 mL/min (ref >60)
Glucose, Bld: 100 mg/dL — ABNORMAL HIGH (ref 70–99)
Potassium: 3.9 mmol/L (ref 3.5–5.1)
Sodium: 134 mmol/L — ABNORMAL LOW (ref 135–145)
Total Bilirubin: 0.9 mg/dL (ref 0.3–1.2)
Total Protein: 7.6 g/dL (ref 6.5–8.1)

## 2021-01-04 LAB — LIPID PANEL
Cholesterol: 191 mg/dL (ref 0–200)
HDL: 60 mg/dL (ref >40)
LDL Cholesterol: 106 mg/dL — ABNORMAL HIGH (ref 0–99)
Total CHOL/HDL Ratio: 3.2 RATIO
Triglycerides: 125 mg/dL (ref <150)
VLDL: 25 mg/dL (ref 0–40)

## 2021-01-10 ENCOUNTER — Encounter: Payer: Self-pay | Admitting: Physician Assistant

## 2021-01-10 ENCOUNTER — Ambulatory Visit: Payer: Self-pay | Admitting: Physician Assistant

## 2021-01-10 DIAGNOSIS — Z5320 Procedure and treatment not carried out because of patient's decision for unspecified reasons: Secondary | ICD-10-CM

## 2021-01-10 DIAGNOSIS — E785 Hyperlipidemia, unspecified: Secondary | ICD-10-CM

## 2021-01-10 NOTE — Progress Notes (Signed)
There were no vitals taken for this visit.   Subjective:    Patient ID: Jennifer Morton, female    DOB: [birth year], 60 y.o.   MRN: [id number]  HPI: Jennifer Morton is a 60 y.o. female presenting on 01/10/2021 for No chief complaint on file.   HPI    This is a telemedicine appointment due to coronavirus pandemic,  It is via telephone as pt does not have a video enabled device.  I connected with  Jennifer Morton on 01/10/21 by a video enabled telemedicine application and verified that I am speaking with the correct person using two identifiers.   I discussed the limitations of evaluation and management by telemedicine. The patient expressed understanding and agreed to proceed.  Pt is at home  Provider is at office.    Pt is 59yoF with routine f/u for hypercholesterolemia  She got covid vacciination and booster  She says she is Doing well and has no complaints.     Relevant past medical, surgical, family and social history reviewed and updated as indicated. Interim medical history since our last visit reviewed. Allergies and medications reviewed and updated.    Current Outpatient Medications:  .  atorvastatin (LIPITOR) 80 MG tablet, Take 1 tablet (80 mg total) by mouth daily., Disp: 90 tablet, Rfl: 1 .  cetirizine (ZYRTEC) 10 MG tablet, TAKE 1 Tablet BY MOUTH ONCE EVERY DAY AS NEEDED FOR ALLERGIES, Disp: 90 tablet, Rfl: 1 .  omeprazole (PRILOSEC) 40 MG capsule, Take 1 capsule (40 mg total) by mouth daily., Disp: 180 capsule, Rfl: 1    Review of Systems  Per HPI unless specifically indicated above     Objective:    There were no vitals taken for this visit.  Wt Readings from Last 3 Encounters:  09/27/20 184 lb 8 oz (83.7 kg)  03/01/20 182 lb 11.2 oz (82.9 kg)  04/07/19 178 lb (80.7 kg)    Physical Exam Pulmonary:     Effort: No respiratory distress.     Comments: Pt is talking in complete sentences without sob Neurological:     Mental Status: She is alert and  oriented to person, place, and time.  Psychiatric:        Attention and Perception: Attention normal.        Speech: Speech normal.        Behavior: Behavior is cooperative.     Comments: Pt is pleasant and converses easily     Results for orders placed or performed during the hospital encounter of 01/04/21  Lipid panel  Result Value Ref Range   Cholesterol 191 0 - 200 mg/dL   Triglycerides 294 <765 mg/dL   HDL 60 >46 mg/dL   Total CHOL/HDL Ratio 3.2 RATIO   VLDL 25 0 - 40 mg/dL   LDL Cholesterol 503 (H) 0 - 99 mg/dL  Comprehensive metabolic panel  Result Value Ref Range   Sodium 134 (L) 135 - 145 mmol/L   Potassium 3.9 3.5 - 5.1 mmol/L   Chloride 104 98 - 111 mmol/L   CO2 22 22 - 32 mmol/L   Glucose, Bld 100 (H) 70 - 99 mg/dL   BUN 13 8 - 23 mg/dL   Creatinine, Ser 5.46 0.44 - 1.00 mg/dL   Calcium 8.7 (L) 8.9 - 10.3 mg/dL   Total Protein 7.6 6.5 - 8.1 g/dL   Albumin 4.1 3.5 - 5.0 g/dL   AST 46 (H) 15 - 41 U/L   ALT 60 (H) 0 -  44 U/L   Alkaline Phosphatase 98 38 - 126 U/L   Total Bilirubin 0.9 0.3 - 1.2 mg/dL   GFR, Estimated >19 >37 mL/min   Anion gap 8 5 - 15      Assessment & Plan:    Encounter Diagnoses  Name Primary?  . Hyperlipidemia, unspecified hyperlipidemia type Yes  . Screening mammography declined      -reviewed labs with pt -will Monitor LFTs.  Pt is counseled to avoid APAP -pt to Continue current medications -pt Declined screening mammogram -pt to follow up 3 months.  She is to contact office sooner prn

## 2021-03-28 ENCOUNTER — Other Ambulatory Visit: Payer: Self-pay | Admitting: Physician Assistant

## 2021-03-28 DIAGNOSIS — E785 Hyperlipidemia, unspecified: Secondary | ICD-10-CM

## 2021-03-30 ENCOUNTER — Other Ambulatory Visit: Payer: Self-pay | Admitting: Physician Assistant

## 2021-04-01 ENCOUNTER — Other Ambulatory Visit: Payer: Self-pay

## 2021-04-01 ENCOUNTER — Other Ambulatory Visit (HOSPITAL_COMMUNITY)
Admission: RE | Admit: 2021-04-01 | Discharge: 2021-04-01 | Disposition: A | Discharge status: Home / Self Care | Payer: Self-pay | Source: Ambulatory Visit | Attending: Physician Assistant | Admitting: Physician Assistant

## 2021-04-01 DIAGNOSIS — E785 Hyperlipidemia, unspecified: Secondary | ICD-10-CM | POA: Insufficient documentation

## 2021-04-01 LAB — COMPREHENSIVE METABOLIC PANEL
ALT: 29 U/L (ref 0–44)
AST: 28 U/L (ref 15–41)
Albumin: 4.1 g/dL (ref 3.5–5.0)
Alkaline Phosphatase: 95 U/L (ref 38–126)
Anion gap: 7 (ref 5–15)
BUN: 16 mg/dL (ref 8–23)
CO2: 24 mmol/L (ref 22–32)
Calcium: 9.1 mg/dL (ref 8.9–10.3)
Chloride: 107 mmol/L (ref 98–111)
Creatinine, Ser: 0.73 mg/dL (ref 0.44–1.00)
GFR, Estimated: >60 mL/min (ref >60)
Glucose, Bld: 90 mg/dL (ref 70–99)
Potassium: 4.2 mmol/L (ref 3.5–5.1)
Sodium: 138 mmol/L (ref 135–145)
Total Bilirubin: 1.1 mg/dL (ref 0.3–1.2)
Total Protein: 7.4 g/dL (ref 6.5–8.1)

## 2021-04-01 LAB — LIPID PANEL
Cholesterol: 319 mg/dL — ABNORMAL HIGH (ref 0–200)
HDL: 61 mg/dL (ref >40)
LDL Cholesterol: 213 mg/dL — ABNORMAL HIGH (ref 0–99)
Total CHOL/HDL Ratio: 5.2 RATIO
Triglycerides: 227 mg/dL — ABNORMAL HIGH (ref <150)
VLDL: 45 mg/dL — ABNORMAL HIGH (ref 0–40)

## 2021-04-07 ENCOUNTER — Other Ambulatory Visit: Payer: Self-pay

## 2021-04-07 ENCOUNTER — Encounter: Payer: Self-pay | Admitting: Physician Assistant

## 2021-04-07 ENCOUNTER — Ambulatory Visit: Payer: Self-pay | Admitting: Physician Assistant

## 2021-04-07 VITALS — BP 119/81 | HR 59 | Temp 97.8°F | Wt 184.0 lb

## 2021-04-07 DIAGNOSIS — E785 Hyperlipidemia, unspecified: Secondary | ICD-10-CM

## 2021-04-07 DIAGNOSIS — Z5320 Procedure and treatment not carried out because of patient's decision for unspecified reasons: Secondary | ICD-10-CM

## 2021-04-07 DIAGNOSIS — R1310 Dysphagia, unspecified: Secondary | ICD-10-CM

## 2021-04-07 MED ORDER — ATORVASTATIN CALCIUM 80 MG PO TABS: 1.0000 | ORAL_TABLET | Freq: Every day | ORAL | 1 refills | Status: DC

## 2021-04-07 MED ORDER — OMEPRAZOLE 40 MG PO CPDR: 1.0000 | DELAYED_RELEASE_CAPSULE | Freq: Two times a day (BID) | ORAL | 1 refills | Status: DC

## 2021-04-07 NOTE — Progress Notes (Signed)
BP 119/81   Pulse (!) 59   Temp 97.8 F (36.6 C)   Wt 184 lb (83.5 kg)   SpO2 98%   BMI 33.12 kg/m    Subjective:    Patient ID: Jennifer Morton, female    DOB: 1960-12-26, 60 y.o.   MRN: [id number]  HPI: Jennifer Morton is a 60 y.o. female presenting on 04/07/2021 for Hyperlipidemia   HPI   Pt had a negative covid 19 screening questionnaire.     Pt is 60yoF who presents for follow-up dyslipidemia.  She says she is doing okay except feeling that food gerts stuck.  She says this has been going on for 2 or 3 months.   Relevant past medical, surgical, family and social history reviewed and updated as indicated. Interim medical history since our last visit reviewed. Allergies and medications reviewed and updated.   Current Outpatient Medications:  .  atorvastatin (LIPITOR) 80 MG tablet, TAKE 1 TABLET BY MOUTH EVERY DAY, Disp: 90 tablet, Rfl: 1 .  cetirizine (ZYRTEC) 10 MG tablet, TAKE 1 Tablet BY MOUTH ONCE EVERY DAY AS NEEDED FOR ALLERGIES, Disp: 90 tablet, Rfl: 1 .  omeprazole (PRILOSEC) 40 MG capsule, TAKE ONE CAPSULE BY MOUTH EVERY DAY, Disp: 180 capsule, Rfl: 1    Review of Systems  Per HPI unless specifically indicated above     Objective:    BP 119/81   Pulse (!) 59   Temp 97.8 F (36.6 C)   Wt 184 lb (83.5 kg)   SpO2 98%   BMI 33.12 kg/m   Wt Readings from Last 3 Encounters:  04/07/21 184 lb (83.5 kg)  09/27/20 184 lb 8 oz (83.7 kg)  03/01/20 182 lb 11.2 oz (82.9 kg)    Physical Exam Vitals reviewed.  Constitutional:      General: She is not in acute distress.    Appearance: She is well-developed. She is not toxic-appearing.  HENT:     Head: Normocephalic and atraumatic.  Cardiovascular:     Rate and Rhythm: Normal rate and regular rhythm.  Pulmonary:     Effort: Pulmonary effort is normal.     Breath sounds: Normal breath sounds.  Abdominal:     General: Bowel sounds are normal.     Palpations: Abdomen is soft. There is no mass.      Tenderness: There is abdominal tenderness in the epigastric area. There is no guarding or rebound.  Musculoskeletal:     Cervical back: Neck supple.     Right lower leg: No edema.     Left lower leg: No edema.  Lymphadenopathy:     Cervical: No cervical adenopathy.  Skin:    General: Skin is warm and dry.  Neurological:     Mental Status: She is alert and oriented to person, place, and time.  Psychiatric:        Behavior: Behavior normal.     Results for orders placed or performed during the hospital encounter of 04/01/21  Lipid panel  Result Value Ref Range   Cholesterol 319 (H) 0 - 200 mg/dL   Triglycerides 086 (H) <150 mg/dL   HDL 61 >76 mg/dL   Total CHOL/HDL Ratio 5.2 RATIO   VLDL 45 (H) 0 - 40 mg/dL   LDL Cholesterol 195 (H) 0 - 99 mg/dL  Comprehensive metabolic panel  Result Value Ref Range   Sodium 138 135 - 145 mmol/L   Potassium 4.2 3.5 - 5.1 mmol/L   Chloride 107 98 -  111 mmol/L   CO2 24 22 - 32 mmol/L   Glucose, Bld 90 70 - 99 mg/dL   BUN 16 8 - 23 mg/dL   Creatinine, Ser 1.61 0.44 - 1.00 mg/dL   Calcium 9.1 8.9 - 09.6 mg/dL   Total Protein 7.4 6.5 - 8.1 g/dL   Albumin 4.1 3.5 - 5.0 g/dL   AST 28 15 - 41 U/L   ALT 29 0 - 44 U/L   Alkaline Phosphatase 95 38 - 126 U/L   Total Bilirubin 1.1 0.3 - 1.2 mg/dL   GFR, Estimated >04 >54 mL/min   Anion gap 7 5 - 15      Assessment & Plan:   Encounter Diagnoses  Name Primary?  . Hyperlipidemia, unspecified hyperlipidemia type Yes  . Screening mammography declined   . Dysphagia, unspecified type      -reviewed labs with pt -pt to continue current atorvastatin. She is encouraged to follow lowfat diet -pt Declined screening mammogram -will incrase omeprazole.  Will refer to GI for dysphagia -pt to follow up 3 months.  She is to contact office sooner prn

## 2021-04-12 ENCOUNTER — Ambulatory Visit: Payer: Self-pay | Admitting: Physician Assistant

## 2021-04-18 ENCOUNTER — Encounter: Payer: Self-pay | Admitting: *Deleted

## 2021-05-26 ENCOUNTER — Ambulatory Visit: Payer: Self-pay | Admitting: Internal Medicine

## 2021-06-27 ENCOUNTER — Other Ambulatory Visit: Payer: Self-pay | Admitting: Physician Assistant

## 2021-06-27 DIAGNOSIS — E785 Hyperlipidemia, unspecified: Secondary | ICD-10-CM

## 2021-06-30 ENCOUNTER — Other Ambulatory Visit (HOSPITAL_COMMUNITY)
Admission: RE | Admit: 2021-06-30 | Discharge: 2021-06-30 | Disposition: A | Discharge status: Home / Self Care | Payer: Self-pay | Source: Ambulatory Visit | Attending: Physician Assistant | Admitting: Physician Assistant

## 2021-06-30 DIAGNOSIS — E785 Hyperlipidemia, unspecified: Secondary | ICD-10-CM | POA: Insufficient documentation

## 2021-06-30 LAB — LIPID PANEL
Cholesterol: 180 mg/dL (ref 0–200)
HDL: 60 mg/dL (ref >40)
LDL Cholesterol: 94 mg/dL (ref 0–99)
Total CHOL/HDL Ratio: 3 RATIO
Triglycerides: 131 mg/dL (ref <150)
VLDL: 26 mg/dL (ref 0–40)

## 2021-06-30 LAB — HEPATIC FUNCTION PANEL
ALT: 61 U/L — ABNORMAL HIGH (ref 0–44)
AST: 49 U/L — ABNORMAL HIGH (ref 15–41)
Albumin: 4.1 g/dL (ref 3.5–5.0)
Alkaline Phosphatase: 101 U/L (ref 38–126)
Bilirubin, Direct: 0.2 mg/dL (ref 0.0–0.2)
Indirect Bilirubin: 0.8 mg/dL (ref 0.3–0.9)
Total Bilirubin: 1 mg/dL (ref 0.3–1.2)
Total Protein: 7.4 g/dL (ref 6.5–8.1)

## 2021-07-05 ENCOUNTER — Encounter: Payer: Self-pay | Admitting: Physician Assistant

## 2021-07-05 ENCOUNTER — Ambulatory Visit: Payer: Self-pay | Admitting: Physician Assistant

## 2021-07-05 VITALS — BP 111/78 | HR 80 | Temp 97.6°F | Wt 186.0 lb

## 2021-07-05 DIAGNOSIS — R1310 Dysphagia, unspecified: Secondary | ICD-10-CM

## 2021-07-05 DIAGNOSIS — K219 Gastro-esophageal reflux disease without esophagitis: Secondary | ICD-10-CM

## 2021-07-05 DIAGNOSIS — Z5320 Procedure and treatment not carried out because of patient's decision for unspecified reasons: Secondary | ICD-10-CM

## 2021-07-05 DIAGNOSIS — E785 Hyperlipidemia, unspecified: Secondary | ICD-10-CM

## 2021-07-05 DIAGNOSIS — Z1211 Encounter for screening for malignant neoplasm of colon: Secondary | ICD-10-CM

## 2021-07-05 NOTE — Progress Notes (Signed)
BP 111/78   Pulse 80   Temp 97.6 F (36.4 C)   Wt 186 lb (84.4 kg)   SpO2 93%   BMI 33.48 kg/m    Subjective:    Patient ID: Jennifer Morton, female    DOB: Nov 22, 1960, 60 y.o.   MRN: [id number]  HPI: Jennifer Morton is a 60 y.o. female presenting on 07/05/2021 for Hyperlipidemia, Gastroesophageal Reflux, and Dysphagia   HPI  Pt had a negative covid 19 screening questionnaire.  Chief Complaint  Patient presents with   Hyperlipidemia   Gastroesophageal Reflux   Dysphagia     Pt says she Still has dysphagia but better than it was.  She hasn't scheduled with GI yet due to she hasn't heard if she got approved for cafa.  She has no new complaints.    Relevant past medical, surgical, family and social history reviewed and updated as indicated. Interim medical history since our last visit reviewed. Allergies and medications reviewed and updated.   Current Outpatient Medications:    atorvastatin (LIPITOR) 80 MG tablet, Take 1 tablet (80 mg total) by mouth daily., Disp: 90 tablet, Rfl: 1   cetirizine (ZYRTEC) 10 MG tablet, TAKE 1 Tablet BY MOUTH ONCE EVERY DAY AS NEEDED FOR ALLERGIES, Disp: 90 tablet, Rfl: 1   omeprazole (PRILOSEC) 40 MG capsule, Take 1 capsule (40 mg total) by mouth in the morning and at bedtime., Disp: 180 capsule, Rfl: 1    Review of Systems  Per HPI unless specifically indicated above     Objective:    BP 111/78   Pulse 80   Temp 97.6 F (36.4 C)   Wt 186 lb (84.4 kg)   SpO2 93%   BMI 33.48 kg/m   Wt Readings from Last 3 Encounters:  07/05/21 186 lb (84.4 kg)  04/07/21 184 lb (83.5 kg)  09/27/20 184 lb 8 oz (83.7 kg)    Physical Exam Vitals reviewed.  Constitutional:      General: She is not in acute distress.    Appearance: She is well-developed. She is not ill-appearing.  HENT:     Head: Normocephalic and atraumatic.  Cardiovascular:     Rate and Rhythm: Normal rate and regular rhythm.  Pulmonary:     Effort: Pulmonary effort is  normal.     Breath sounds: Normal breath sounds.  Abdominal:     General: Bowel sounds are normal.     Palpations: Abdomen is soft. There is no mass.     Tenderness: There is no abdominal tenderness.  Musculoskeletal:     Cervical back: Neck supple.     Right lower leg: No edema.     Left lower leg: No edema.  Lymphadenopathy:     Cervical: No cervical adenopathy.  Skin:    General: Skin is warm and dry.  Neurological:     Mental Status: She is alert and oriented to person, place, and time.  Psychiatric:        Attention and Perception: Attention normal.        Speech: Speech normal.        Behavior: Behavior normal. Behavior is cooperative.    Results for orders placed or performed during the hospital encounter of 06/30/21  Lipid panel  Result Value Ref Range   Cholesterol 180 0 - 200 mg/dL   Triglycerides 789 <381 mg/dL   HDL 60 >01 mg/dL   Total CHOL/HDL Ratio 3.0 RATIO   VLDL 26 0 - 40 mg/dL  LDL Cholesterol 94 0 - 99 mg/dL  Hepatic function panel  Result Value Ref Range   Total Protein 7.4 6.5 - 8.1 g/dL   Albumin 4.1 3.5 - 5.0 g/dL   AST 49 (H) 15 - 41 U/L   ALT 61 (H) 0 - 44 U/L   Alkaline Phosphatase 101 38 - 126 U/L   Total Bilirubin 1.0 0.3 - 1.2 mg/dL   Bilirubin, Direct 0.2 0.0 - 0.2 mg/dL   Indirect Bilirubin 0.8 0.3 - 0.9 mg/dL      Assessment & Plan:    Encounter Diagnoses  Name Primary?   Hyperlipidemia, unspecified hyperlipidemia type Yes   Dysphagia, unspecified type    Gastroesophageal reflux disease, unspecified whether esophagitis present    Screening mammography declined    Screening for colon cancer      dyslipidemia -reviewed labs with pt -pt to continue atorvastatin  HCM -pt Declined screening mammogram -FIT test given for colon cancer screening  Dysphagia/GERD -will Re-enter GI  referral -pt encouraged to contact care connect to check on financial assistance -pt to continue omeprazole bid and avoid spicy, greasy  foods   -pt to follow up 3 months.  She is to contact office sooner for changes or new symptoms

## 2021-07-06 ENCOUNTER — Other Ambulatory Visit: Payer: Self-pay | Admitting: Physician Assistant

## 2021-07-06 DIAGNOSIS — Z1211 Encounter for screening for malignant neoplasm of colon: Secondary | ICD-10-CM

## 2021-08-09 ENCOUNTER — Emergency Department (HOSPITAL_COMMUNITY)
Admission: EM | Admit: 2021-08-09 | Discharge: 2021-08-09 | Disposition: A | Discharge status: Home / Self Care | Payer: Self-pay | Attending: Emergency Medicine | Admitting: Emergency Medicine

## 2021-08-09 ENCOUNTER — Encounter (HOSPITAL_COMMUNITY): Payer: Self-pay | Admitting: *Deleted

## 2021-08-09 ENCOUNTER — Emergency Department (HOSPITAL_COMMUNITY): Payer: Self-pay

## 2021-08-09 DIAGNOSIS — M5441 Lumbago with sciatica, right side: Secondary | ICD-10-CM | POA: Insufficient documentation

## 2021-08-09 DIAGNOSIS — I10 Essential (primary) hypertension: Secondary | ICD-10-CM | POA: Insufficient documentation

## 2021-08-09 DIAGNOSIS — F17220 Nicotine dependence, chewing tobacco, uncomplicated: Secondary | ICD-10-CM | POA: Insufficient documentation

## 2021-08-09 DIAGNOSIS — M5442 Lumbago with sciatica, left side: Secondary | ICD-10-CM | POA: Insufficient documentation

## 2021-08-09 DIAGNOSIS — J45909 Unspecified asthma, uncomplicated: Secondary | ICD-10-CM | POA: Insufficient documentation

## 2021-08-09 DIAGNOSIS — M5432 Sciatica, left side: Secondary | ICD-10-CM

## 2021-08-09 DIAGNOSIS — Z79899 Other long term (current) drug therapy: Secondary | ICD-10-CM | POA: Insufficient documentation

## 2021-08-09 LAB — URINALYSIS, ROUTINE W REFLEX MICROSCOPIC
Bilirubin Urine: NEGATIVE
Glucose, UA: NEGATIVE mg/dL
Hgb urine dipstick: NEGATIVE
Ketones, ur: NEGATIVE mg/dL
Leukocytes,Ua: NEGATIVE
Nitrite: NEGATIVE
Protein, ur: NEGATIVE mg/dL
Specific Gravity, Urine: 1.009 (ref 1.005–1.030)
pH: 5 (ref 5.0–8.0)

## 2021-08-09 MED ORDER — CYCLOBENZAPRINE HCL 5 MG PO TABS: 5.0000 mg | ORAL_TABLET | Freq: Three times a day (TID) | ORAL | 0 refills | Status: DC | PRN

## 2021-08-09 MED ORDER — HYDROCODONE-ACETAMINOPHEN 5-325 MG PO TABS: 1.0000 | ORAL_TABLET | ORAL | 0 refills | Status: DC | PRN

## 2021-08-09 MED ORDER — IBUPROFEN 600 MG PO TABS: 600.0000 mg | ORAL_TABLET | Freq: Three times a day (TID) | ORAL | 0 refills | Status: DC | PRN

## 2021-08-09 MED ORDER — HYDROCODONE-ACETAMINOPHEN 5-325 MG PO TABS
1.0000 | ORAL_TABLET | Freq: Once | ORAL | Status: AC
Start: 2021-08-09 — End: 2021-08-09
  Administered 2021-08-09: 1 via ORAL
  Filled 2021-08-09: qty 1

## 2021-08-09 MED ORDER — IBUPROFEN 400 MG PO TABS
400.0000 mg | ORAL_TABLET | Freq: Once | ORAL | Status: AC
  Administered 2021-08-09: 400 mg via ORAL
  Filled 2021-08-09: qty 1

## 2021-08-09 NOTE — ED Triage Notes (Signed)
Back pain x 2 days °

## 2021-08-09 NOTE — ED Provider Notes (Signed)
[facility] EMERGENCY DEPARTMENT Provider Note   CSN: [id number] Arrival date & time: 08/09/21  1510     History No chief complaint on file.   Jennifer Morton is a 60 y.o. female history of hypertension, GERD, asthma and history of MI presenting for evaluation of low back pain with radiation into her bilateral buttocks and posterior thighs ending just above her knees.  This pain has been present for 2 days.  Pain is worse with movement and certain positions.  She denies any injuries or falls stating she was simply walking when her symptoms started.  She denies weakness or numbness in her legs and has had no urinary or fecal incontinence or retention.  She denies fevers or chills, nausea or vomiting, abdominal pain or distention.  She has taken Tylenol with minimal improvement in her symptoms.  She denies history of sciatica but has had some episodes of low back pain.  The history is provided by the patient.      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     Family History  Problem Relation Age of Onset   Heart disease Mother    Hypertension Mother    Heart failure Mother    Hyperlipidemia Mother    Cancer Father        colon cancer   Diabetes Father    COPD Father    Hearing loss Daughter    Cancer Maternal Uncle        lung cancer   Cancer Maternal Uncle        liver cancer   Cancer Maternal Uncle        back cancer     Social History   Tobacco Use   Smoking status: Never   Smokeless tobacco: Current    Types: Snuff  Vaping Use   Vaping Use: Never used  Substance Use Topics   Alcohol use: No   Drug use: No    Home Medications Prior to Admission medications   Medication Sig Start Date End Date Taking? Authorizing Provider  cyclobenzaprine (FLEXERIL) 5 MG tablet Take 1 tablet (5 mg total) by mouth 3 (three) times daily as needed for muscle spasms. 08/09/21  Yes Kayliana Codd, Raynelle Fanning, PA-C  HYDROcodone-acetaminophen (NORCO/VICODIN) 5-325 MG tablet Take 1 tablet by mouth every 4 (four) hours as needed for moderate pain. 08/09/21  Yes Shaka Zech, Raynelle Fanning, PA-C  ibuprofen (ADVIL) 600 MG tablet Take 1 tablet (600 mg total) by mouth every 8 (eight) hours as needed. 08/09/21  Yes Bethania Schlotzhauer, Raynelle Fanning, PA-C  atorvastatin (LIPITOR) 80 MG tablet Take 1 tablet (80 mg total) by mouth daily. 04/07/21   Jacquelin Hawking, PA-C  cetirizine (ZYRTEC) 10 MG tablet TAKE 1 Tablet BY MOUTH ONCE EVERY DAY AS NEEDED FOR ALLERGIES 06/27/21   Jacquelin Hawking, PA-C  omeprazole (PRILOSEC) 40 MG capsule Take 1 capsule (40 mg total) by mouth in the  morning and at bedtime. 04/07/21   Jacquelin Hawking, PA-C    Allergies    Penicillins and Prednisone  Review of Systems   Review of Systems  Constitutional:  Negative for fever.  Respiratory:  Negative for shortness of breath.   Cardiovascular:  Negative for chest pain and leg swelling.  Gastrointestinal:  Negative for abdominal distention, abdominal pain and constipation.  Genitourinary:  Negative for difficulty urinating, dysuria, flank pain, frequency and urgency.  Musculoskeletal:  Positive for back pain. Negative for gait problem and joint swelling.  Skin:  Negative for rash.  Neurological:  Negative for weakness and numbness.  All other systems reviewed and are negative.  Physical Exam Updated Vital Signs BP (!) 146/80 (BP Location: Right Arm)   Pulse (!) 55   Temp 97.7 F (36.5 C) (Oral)    Resp 15   Ht 5\' 4"  (1.626 m)   Wt 85.7 kg   SpO2 100%   BMI 32.44 kg/m   Physical Exam Vitals and nursing note reviewed.  Constitutional:      Appearance: She is well-developed.  HENT:     Head: Normocephalic.  Eyes:     Conjunctiva/sclera: Conjunctivae normal.  Cardiovascular:     Rate and Rhythm: Normal rate.     Pulses: Normal pulses.     Comments: Pedal pulses normal. Pulmonary:     Effort: Pulmonary effort is normal.  Abdominal:     General: Bowel sounds are normal. There is no distension.     Palpations: Abdomen is soft. There is no mass.  Musculoskeletal:     Cervical back: Normal range of motion and neck supple.     Lumbar back: Tenderness and bony tenderness present. No swelling, edema or spasms. Negative right straight leg raise test and negative left straight leg raise test.     Comments: Midline low lumbar tenderness to palpation without edema or deformity.  Negative straight leg raise bilaterally, reports tightness in her lower hamstrings with this movement.  Also bilateral lumbar soft tissue tenderness.  Skin:    General: Skin is warm and dry.  Neurological:     Mental Status: She is alert.     Sensory: No sensory deficit.     Motor: No tremor or atrophy.     Gait: Gait normal.     Deep Tendon Reflexes:     Reflex Scores:      Patellar reflexes are 2+ on the right side and 2+ on the left side.    Comments: No strength deficit noted in hip and knee flexor and extensor muscle groups.  Ankle flexion and extension intact.    ED Results / Procedures / Treatments   Labs (all labs ordered are listed, but only abnormal results are displayed) Labs Reviewed  URINALYSIS, ROUTINE W REFLEX MICROSCOPIC    EKG None  Radiology DG Lumbar Spine Complete  Result Date: 08/09/2021 CLINICAL DATA:  Bilateral lower back pain radiating to legs for 2 days EXAM: LUMBAR SPINE - COMPLETE 4+ VIEW COMPARISON:  None. FINDINGS: Frontal, bilateral oblique, lateral views of the  lumbar spine are obtained. There are 5 non-rib-bearing lumbar type vertebral bodies with mild left convex scoliosis. No acute fractures. Mild lower lumbar spondylosis greatest at L4-5. Mild facet hypertrophy greatest at L5-S1. Sacroiliac joints are normal. IMPRESSION: 1. Mild lower lumbar spondylosis and facet hypertrophy. No acute fracture. 2. Mild left convex scoliosis. Electronically Signed   By: 08/11/2021 M.D.   On: 08/09/2021 20:26    Procedures Procedures  Medications Ordered in ED Medications  HYDROcodone-acetaminophen (NORCO/VICODIN) 5-325 MG per tablet 1 tablet (1 tablet Oral Given 08/09/21 2030)  ibuprofen (ADVIL) tablet 400 mg (400 mg Oral Given 08/09/21 2030)    ED Course  I have reviewed the triage vital signs and the nursing notes.  Pertinent labs & imaging results that were available during my care of the patient were reviewed by me and considered in my medical decision making (see chart for details).    MDM Rules/Calculators/A&P                           Imaging reviewed and discussed with patient.  Exam is reassuring, history reassuring without signs of cauda equina.  No IV drug use, afebrile, abscess or discitis unlikely.  She was prescribed Flexeril and ibuprofen, also few hydrocodone tablets for pain relief.  We discussed roles of ice and heat, body mechanics for healthy back.  Plan follow-up with her PCP for recheck if symptoms or not improving over the next week with this treatment plan. Final Clinical Impression(s) / ED Diagnoses Final diagnoses:  Bilateral sciatica    Rx / DC Orders ED Discharge Orders          Ordered    HYDROcodone-acetaminophen (NORCO/VICODIN) 5-325 MG tablet  Every 4 hours PRN        08/09/21 2126    cyclobenzaprine (FLEXERIL) 5 MG tablet  3 times daily PRN        08/09/21 2126    ibuprofen (ADVIL) 600 MG tablet  Every 8 hours PRN        08/09/21 2126             Victoriano Lain 08/09/21 2202    Linwood Dibbles,  MD 08/11/21 [phone]

## 2021-08-09 NOTE — Discharge Instructions (Signed)
Use the medicines as prescribed.  Do not drive within 4 hours of taking hydrocodone as this will make you drowsy.  Avoid lifting,  Bending,  Twisting or any other activity that worsens your pain over the next week.  Apply a heating pad to  your lower back for 120 minutes 3 times daily.  You should get rechecked if your symptoms are not better over the next 5 days,  Or you develop increased pain,  Weakness in your leg(s) or loss of bladder or bowel function - these are symptoms of a worse injury.

## 2021-09-21 ENCOUNTER — Other Ambulatory Visit: Payer: Self-pay | Admitting: Physician Assistant

## 2021-09-21 DIAGNOSIS — E785 Hyperlipidemia, unspecified: Secondary | ICD-10-CM

## 2021-10-05 ENCOUNTER — Other Ambulatory Visit: Payer: Self-pay

## 2021-10-05 ENCOUNTER — Other Ambulatory Visit (HOSPITAL_COMMUNITY)
Admission: RE | Admit: 2021-10-05 | Discharge: 2021-10-05 | Disposition: A | Discharge status: Home / Self Care | Payer: Medicaid Other | Source: Ambulatory Visit | Attending: Physician Assistant | Admitting: Physician Assistant

## 2021-10-05 DIAGNOSIS — E785 Hyperlipidemia, unspecified: Secondary | ICD-10-CM | POA: Insufficient documentation

## 2021-10-05 LAB — HEPATIC FUNCTION PANEL
ALT: 43 U/L (ref 0–44)
AST: 36 U/L (ref 15–41)
Albumin: 4.1 g/dL (ref 3.5–5.0)
Alkaline Phosphatase: 107 U/L (ref 38–126)
Bilirubin, Direct: 0.1 mg/dL (ref 0.0–0.2)
Indirect Bilirubin: 1 mg/dL — ABNORMAL HIGH (ref 0.3–0.9)
Total Bilirubin: 1.1 mg/dL (ref 0.3–1.2)
Total Protein: 7.6 g/dL (ref 6.5–8.1)

## 2021-10-05 LAB — LIPID PANEL
Cholesterol: 206 mg/dL — ABNORMAL HIGH (ref 0–200)
HDL: 60 mg/dL (ref >40)
LDL Cholesterol: 112 mg/dL — ABNORMAL HIGH (ref 0–99)
Total CHOL/HDL Ratio: 3.4 RATIO
Triglycerides: 169 mg/dL — ABNORMAL HIGH (ref <150)
VLDL: 34 mg/dL (ref 0–40)

## 2021-10-10 ENCOUNTER — Other Ambulatory Visit: Payer: Self-pay

## 2021-10-10 ENCOUNTER — Ambulatory Visit: Payer: Medicaid Other | Admitting: Physician Assistant

## 2021-10-10 ENCOUNTER — Encounter: Payer: Self-pay | Admitting: Physician Assistant

## 2021-10-10 VITALS — BP 130/89 | HR 74 | Temp 98.0°F | Wt 194.0 lb

## 2021-10-10 DIAGNOSIS — M5432 Sciatica, left side: Secondary | ICD-10-CM

## 2021-10-10 DIAGNOSIS — E785 Hyperlipidemia, unspecified: Secondary | ICD-10-CM

## 2021-10-10 DIAGNOSIS — K219 Gastro-esophageal reflux disease without esophagitis: Secondary | ICD-10-CM

## 2021-10-10 MED ORDER — METHYLPREDNISOLONE ACETATE 80 MG/ML IJ SUSP
80.0000 mg | Freq: Once | INTRAMUSCULAR | Status: AC
  Administered 2021-10-10: 15:00:00 80 mg via INTRAMUSCULAR

## 2021-10-10 NOTE — Progress Notes (Signed)
BP 130/89   Pulse 74   Temp 98 F (36.7 C)   Wt 194 lb (88 kg)   SpO2 96%   BMI 33.30 kg/m    Subjective:    Patient ID: Jennifer Morton, female    DOB: 04-21-1961, 60 y.o.   MRN: [id number]  HPI: Jennifer Morton is a 60 y.o. female presenting on 10/10/2021 for Hyperlipidemia, Gastroesophageal Reflux, and Dysphagia   HPI  Chief Complaint  Patient presents with   Hyperlipidemia   Gastroesophageal Reflux   Dysphagia    Pt says her Genella Rife is better.  She is not having any problems with swallowing or pain.  Pt complains of L leg pain.  She says it hurts enough that it is keeping her awake at night.  The pain Goes from hip/lower back all the way down to the foot.  This started 1-2 month ago. She was seen in ER end of September for this same thing and was given muscle relaxers, IBU and vidocin.  She says these meds didn't help .  She denies injury; she says she just woke up one morning with the pain.   Xrays LS spine 08/09/21 reviewed  Reviewed pt allergies; She can take the prednisone shot but not the pill.  She has not returned her FIT test given in August for colon cancer screening.       Relevant past medical, surgical, family and social history reviewed and updated as indicated. Interim medical history since our last visit reviewed. Allergies and medications reviewed and updated.   Current Outpatient Medications:    atorvastatin (LIPITOR) 80 MG tablet, Take 1 tablet (80 mg total) by mouth daily., Disp: 90 tablet, Rfl: 1   cetirizine (ZYRTEC) 10 MG tablet, TAKE 1 Tablet BY MOUTH ONCE EVERY DAY AS NEEDED FOR ALLERGIES, Disp: 90 tablet, Rfl: 1   omeprazole (PRILOSEC) 40 MG capsule, Take 1 capsule (40 mg total) by mouth in the morning and at bedtime., Disp: 180 capsule, Rfl: 1    Review of Systems  Per HPI unless specifically indicated above     Objective:    BP 130/89   Pulse 74   Temp 98 F (36.7 C)   Wt 194 lb (88 kg)   SpO2 96%   BMI 33.30 kg/m   Wt Readings  from Last 3 Encounters:  10/10/21 194 lb (88 kg)  08/09/21 189 lb (85.7 kg)  07/05/21 186 lb (84.4 kg)    Physical Exam Vitals reviewed.  Constitutional:      Appearance: She is well-developed.  HENT:     Head: Normocephalic and atraumatic.  Cardiovascular:     Rate and Rhythm: Normal rate and regular rhythm.  Pulmonary:     Effort: Pulmonary effort is normal.     Breath sounds: Normal breath sounds.  Abdominal:     General: Bowel sounds are normal.     Palpations: Abdomen is soft. There is no mass.     Tenderness: There is no abdominal tenderness.  Musculoskeletal:     Cervical back: Neck supple.     Lumbar back: Tenderness present. No bony tenderness. Positive left straight leg raise test. Negative right straight leg raise test.     Right lower leg: No edema.     Left lower leg: No edema.     Comments: Very mild non-point tenderness right lumbar area  Lymphadenopathy:     Cervical: No cervical adenopathy.  Skin:    General: Skin is warm and dry.  Neurological:     Mental Status: She is alert and oriented to person, place, and time.  Psychiatric:        Attention and Perception: Attention normal.        Speech: Speech normal.        Behavior: Behavior normal. Behavior is cooperative.    Results for orders placed or performed during the hospital encounter of 10/05/21  Lipid panel  Result Value Ref Range   Cholesterol 206 (H) 0 - 200 mg/dL   Triglycerides 948 (H) <150 mg/dL   HDL 60 >54 mg/dL   Total CHOL/HDL Ratio 3.4 RATIO   VLDL 34 0 - 40 mg/dL   LDL Cholesterol 627 (H) 0 - 99 mg/dL  Hepatic function panel  Result Value Ref Range   Total Protein 7.6 6.5 - 8.1 g/dL   Albumin 4.1 3.5 - 5.0 g/dL   AST 36 15 - 41 U/L   ALT 43 0 - 44 U/L   Alkaline Phosphatase 107 38 - 126 U/L   Total Bilirubin 1.1 0.3 - 1.2 mg/dL   Bilirubin, Direct 0.1 0.0 - 0.2 mg/dL   Indirect Bilirubin 1.0 (H) 0.3 - 0.9 mg/dL      Assessment & Plan:   Encounter Diagnoses  Name Primary?    Hyperlipidemia, unspecified hyperlipidemia type Yes   Sciatica of left side    Gastroesophageal reflux disease, unspecified whether esophagitis present      -reviewed labs with pt  -Depo medrol IM given.  She is encouraged to use ice/heat.  She is to RTO if this fails to bring improvement -Continue current rx for lipids -pt to follow up for dyslipidemia in 6 months.  She is to contact office sooner prn

## 2021-12-09 ENCOUNTER — Other Ambulatory Visit: Payer: Self-pay | Admitting: Physician Assistant

## 2022-02-28 ENCOUNTER — Other Ambulatory Visit: Payer: Self-pay | Admitting: Physician Assistant

## 2022-04-11 ENCOUNTER — Ambulatory Visit: Payer: Medicaid Other | Admitting: Physician Assistant

## 2022-04-12 ENCOUNTER — Ambulatory Visit (INDEPENDENT_AMBULATORY_CARE_PROVIDER_SITE_OTHER): Payer: 59 | Admitting: Family Medicine

## 2022-04-12 ENCOUNTER — Encounter: Payer: Self-pay | Admitting: Family Medicine

## 2022-04-12 VITALS — BP 126/85 | HR 74 | Temp 98.1°F | Ht 64.0 in | Wt 192.0 lb

## 2022-04-12 DIAGNOSIS — M4126 Other idiopathic scoliosis, lumbar region: Secondary | ICD-10-CM

## 2022-04-12 DIAGNOSIS — E785 Hyperlipidemia, unspecified: Secondary | ICD-10-CM

## 2022-04-12 DIAGNOSIS — J4520 Mild intermittent asthma, uncomplicated: Secondary | ICD-10-CM

## 2022-04-12 DIAGNOSIS — K2100 Gastro-esophageal reflux disease with esophagitis, without bleeding: Secondary | ICD-10-CM | POA: Diagnosis not present

## 2022-04-12 MED ORDER — ALBUTEROL SULFATE HFA 108 (90 BASE) MCG/ACT IN AERS: 2.0000 | INHALATION_SPRAY | Freq: Four times a day (QID) | RESPIRATORY_TRACT | 11 refills | Status: AC | PRN

## 2022-04-12 MED ORDER — CELECOXIB 200 MG PO CAPS: 200.0000 mg | ORAL_CAPSULE | Freq: Every day | ORAL | 5 refills | Status: DC

## 2022-04-12 MED ORDER — CETIRIZINE HCL 10 MG PO TABS: ORAL_TABLET | ORAL | 3 refills | Status: AC

## 2022-04-12 MED ORDER — RABEPRAZOLE SODIUM 20 MG PO TBEC: 20.0000 mg | DELAYED_RELEASE_TABLET | Freq: Every day | ORAL | 3 refills | Status: DC

## 2022-04-12 MED ORDER — ATORVASTATIN CALCIUM 80 MG PO TABS: 80.0000 mg | ORAL_TABLET | Freq: Every day | ORAL | 3 refills | Status: AC

## 2022-04-12 NOTE — Progress Notes (Signed)
Subjective:  Patient ID: Jennifer Morton, female    DOB: [birth year]  Age: 61 y.o. MRN: [id number]  CC: Establish Care   HPI TEANNA ELEM presents for Patient in for follow-up of GERD. Currently asymptomatic taking  PPI daily. There is no chest pain or heartburn. No hematemesis and no melena. No dysphagia or choking. Onset is remote. Progression is stable. Complicating factors, none.  ASthma flares with temperature extremes, high or low. Out of albuterol   Recent L sciatica. USed flexeril, other meds, still some discomfort, especially at night.  MIgraines with xs stress. Works on [employer], dispensing. Gets a bad one q 2 mos approx.  Relief by laying down to sleep. Also takes Fioricet     04/12/2022    1:13 PM  Depression screen PHQ 2/9  Decreased Interest 0  Down, Depressed, Hopeless 0  PHQ - 2 Score 0    History Archana has a past medical history of Asthma, GERD (gastroesophageal reflux disease), Hyperlipidemia, and MI (myocardial infarction) (HCC).   She has a past surgical history that includes Abdominal hysterectomy; Cholecystectomy; and Bladder surgery (2017).   Her family history includes COPD in her father; Cancer in her father, maternal uncle, maternal uncle, and maternal uncle; Diabetes in her father; Hearing loss in her daughter; Heart disease in her mother; Heart failure in her mother; Hyperlipidemia in her mother; Hypertension in her mother.She reports that she has never smoked. Her smokeless tobacco use includes snuff. She reports that she does not drink alcohol and does not use drugs.    ROS Review of Systems  Objective:  BP 126/85   Pulse 74   Temp 98.1 F (36.7 C)   Ht 5\' 4"  (1.626 m)   Wt 192 lb (87.1 kg)   SpO2 97%   BMI 32.96 kg/m   BP Readings from Last 3 Encounters:  04/12/22 126/85  10/10/21 130/89  08/09/21 (!) 141/68    Wt Readings from Last 3 Encounters:  04/12/22 192 lb (87.1 kg)  10/10/21 194 lb (88 kg)  08/09/21 189 lb (85.7 kg)      Physical Exam    Assessment & Plan:   There are no diagnoses linked to this encounter.     I am having Sruthi Maurer. Mathenia maintain her atorvastatin, cetirizine, and omeprazole.  Allergies as of 04/12/2022       Reactions   Penicillins Rash   Did it involve swelling of the face/tongue/throat, SOB, or low BP? Yes-facial swelling Did it involve sudden or severe rash/hives, skin peeling, or any reaction on the inside of your mouth or nose? 04/14/2022 to back/stomach Did you need to seek medical attention at a hospital or doctor's office? Yes When did it last happen?      10 years prior If all above answers are "NO", may proceed with cephalosporin use.   Prednisone Rash   "can take the shot but cannot take the pills"        Medication List        Accurate as of Apr 12, 2022  1:32 PM. If you have any questions, ask your nurse or doctor.          atorvastatin 80 MG tablet Commonly known as: LIPITOR Take 1 tablet (80 mg total) by mouth daily.   cetirizine 10 MG tablet Commonly known as: ZYRTEC TAKE 1 Tablet BY MOUTH ONCE EVERY DAY AS NEEDED FOR ALLERGIES   omeprazole 40 MG capsule Commonly known as: PRILOSEC TAKE 1 Capsule BY MOUTH  ONCE EVERY MORNING AND EVERY NIGHT AT BEDTIME         Follow-up: No follow-ups on file.  Mechele Claude, M.D.

## 2022-04-13 ENCOUNTER — Other Ambulatory Visit: Payer: 59

## 2022-04-13 LAB — URINALYSIS
Bilirubin, UA: NEGATIVE
Glucose, UA: NEGATIVE
Ketones, UA: NEGATIVE
Nitrite, UA: NEGATIVE
Protein,UA: NEGATIVE
RBC, UA: NEGATIVE
Specific Gravity, UA: 1.02 (ref 1.005–1.030)
Urobilinogen, Ur: 0.2 mg/dL (ref 0.2–1.0)
pH, UA: 5.5 (ref 5.0–7.5)

## 2022-04-14 LAB — CMP14+EGFR
ALT: 21 IU/L (ref 0–32)
AST: 22 IU/L (ref 0–40)
Albumin/Globulin Ratio: 1.8 (ref 1.2–2.2)
Albumin: 4.6 g/dL (ref 3.8–4.8)
Alkaline Phosphatase: 124 IU/L — ABNORMAL HIGH (ref 44–121)
BUN/Creatinine Ratio: 15 (ref 12–28)
BUN: 12 mg/dL (ref 8–27)
Bilirubin Total: 0.6 mg/dL (ref 0.0–1.2)
CO2: 20 mmol/L (ref 20–29)
Calcium: 9.1 mg/dL (ref 8.7–10.3)
Chloride: 104 mmol/L (ref 96–106)
Creatinine, Ser: 0.78 mg/dL (ref 0.57–1.00)
Globulin, Total: 2.5 g/dL (ref 1.5–4.5)
Glucose: 89 mg/dL (ref 70–99)
Potassium: 4.1 mmol/L (ref 3.5–5.2)
Sodium: 142 mmol/L (ref 134–144)
Total Protein: 7.1 g/dL (ref 6.0–8.5)
eGFR: 85 mL/min/{1.73_m2} (ref >59)

## 2022-04-14 LAB — CBC WITH DIFFERENTIAL/PLATELET
Basophils Absolute: 0.1 10*3/uL (ref 0.0–0.2)
Basos: 1 %
EOS (ABSOLUTE): 0.2 10*3/uL (ref 0.0–0.4)
Eos: 2 %
Hematocrit: 43 % (ref 34.0–46.6)
Hemoglobin: 14 g/dL (ref 11.1–15.9)
Immature Grans (Abs): 0 10*3/uL (ref 0.0–0.1)
Immature Granulocytes: 0 %
Lymphocytes Absolute: 2.2 10*3/uL (ref 0.7–3.1)
Lymphs: 28 %
MCH: 28.3 pg (ref 26.6–33.0)
MCHC: 32.6 g/dL (ref 31.5–35.7)
MCV: 87 fL (ref 79–97)
Monocytes Absolute: 0.6 10*3/uL (ref 0.1–0.9)
Monocytes: 8 %
Neutrophils Absolute: 4.8 10*3/uL (ref 1.4–7.0)
Neutrophils: 61 %
Platelets: 328 10*3/uL (ref 150–450)
RBC: 4.95 x10E6/uL (ref 3.77–5.28)
RDW: 13.1 % (ref 11.7–15.4)
WBC: 7.9 10*3/uL (ref 3.4–10.8)

## 2022-04-14 LAB — LIPID PANEL
Chol/HDL Ratio: 5.4 ratio — ABNORMAL HIGH (ref 0.0–4.4)
Cholesterol, Total: 287 mg/dL — ABNORMAL HIGH (ref 100–199)
HDL: 53 mg/dL (ref >39)
LDL Chol Calc (NIH): 183 mg/dL — ABNORMAL HIGH (ref 0–99)
Triglycerides: 268 mg/dL — ABNORMAL HIGH (ref 0–149)
VLDL Cholesterol Cal: 51 mg/dL — ABNORMAL HIGH (ref 5–40)

## 2022-04-17 ENCOUNTER — Other Ambulatory Visit: Payer: Self-pay | Admitting: *Deleted

## 2022-04-17 DIAGNOSIS — R300 Dysuria: Secondary | ICD-10-CM

## 2022-04-17 DIAGNOSIS — R102 Pelvic and perineal pain: Secondary | ICD-10-CM

## 2022-04-18 ENCOUNTER — Other Ambulatory Visit: Payer: 59

## 2022-04-18 DIAGNOSIS — R300 Dysuria: Secondary | ICD-10-CM

## 2022-04-18 DIAGNOSIS — R102 Pelvic and perineal pain: Secondary | ICD-10-CM

## 2022-04-19 LAB — URINE CULTURE

## 2022-07-04 ENCOUNTER — Encounter: Payer: Self-pay | Admitting: Nurse Practitioner

## 2022-07-04 ENCOUNTER — Ambulatory Visit (INDEPENDENT_AMBULATORY_CARE_PROVIDER_SITE_OTHER): Payer: 59 | Admitting: Nurse Practitioner

## 2022-07-04 VITALS — BP 124/86 | HR 69 | Ht 64.0 in | Wt 186.0 lb

## 2022-07-04 DIAGNOSIS — M25511 Pain in right shoulder: Secondary | ICD-10-CM

## 2022-07-04 MED ORDER — DICLOFENAC SODIUM 1 % EX GEL: 2.0000 g | Freq: Four times a day (QID) | CUTANEOUS | 0 refills | Status: DC

## 2022-07-04 MED ORDER — METHOCARBAMOL 500 MG PO TABS: 500.0000 mg | ORAL_TABLET | Freq: Four times a day (QID) | ORAL | 1 refills | Status: DC

## 2022-07-04 MED ORDER — METHYLPREDNISOLONE ACETATE 40 MG/ML IJ SUSP
40.0000 mg | Freq: Once | INTRAMUSCULAR | Status: AC
  Administered 2022-07-04: 40 mg via INTRAMUSCULAR

## 2022-07-04 NOTE — Addendum Note (Signed)
Addended by: Dorene Sorrow on: 07/04/2022 03:48 PM   Modules accepted: Orders

## 2022-07-04 NOTE — Patient Instructions (Signed)

## 2022-07-04 NOTE — Progress Notes (Signed)
Acute Office Visit  Subjective:     Patient ID: Jennifer Morton, female    DOB: 02/15/1961, 61 y.o.   MRN: [id number]  Chief Complaint  Patient presents with   Fall   Arm Pain    Right shoulder and elbow    Fall The accident occurred 6 to 12 hours ago. The fall occurred while walking. She fell from an unknown height. She landed on Concrete. The point of impact was the right shoulder. The pain is present in the right shoulder and left hand. The pain is at a severity of 8/10. The pain is severe. The symptoms are aggravated by movement. Pertinent negatives include no tingling. She has tried ice and elevation for the symptoms.  Arm Pain  The incident occurred 6 to 12 hours ago. The incident occurred at home. The injury mechanism was a fall. The pain is present in the right hand. The quality of the pain is described as aching. The pain radiates to the right arm. The pain is at a severity of 8/10. The pain is severe. The pain has been Fluctuating since the incident. Pertinent negatives include no tingling. The symptoms are aggravated by movement.  Shoulder Pain  The pain is present in the right shoulder. This is a new problem. The current episode started today. There has been a history of trauma. The problem has been gradually worsening. The quality of the pain is described as aching. The pain is at a severity of 8/10. The pain is severe. Pertinent negatives include no itching, joint locking, stiffness or tingling. The symptoms are aggravated by activity.    Review of Systems  Constitutional: Negative.   HENT: Negative.    Eyes: Negative.   Respiratory: Negative.    Cardiovascular: Negative.   Musculoskeletal:  Positive for joint pain. Negative for stiffness.  Skin: Negative.  Negative for itching and rash.  Neurological:  Negative for tingling.  All other systems reviewed and are negative.       Objective:    BP 124/86   Pulse 69   Ht 5\' 4"  (1.626 m)   Wt 186 lb (84.4 kg)   SpO2  98%   BMI 31.93 kg/m  BP Readings from Last 3 Encounters:  07/04/22 124/86  04/12/22 126/85  10/10/21 130/89   Wt Readings from Last 3 Encounters:  07/04/22 186 lb (84.4 kg)  04/12/22 192 lb (87.1 kg)  10/10/21 194 lb (88 kg)      Physical Exam Vitals reviewed.  HENT:     Head: Normocephalic.     Left Ear: External ear normal.     Nose: Nose normal.     Mouth/Throat:     Mouth: Mucous membranes are moist.     Pharynx: Oropharynx is clear.  Eyes:     Conjunctiva/sclera: Conjunctivae normal.  Cardiovascular:     Rate and Rhythm: Normal rate and regular rhythm.  Pulmonary:     Effort: Pulmonary effort is normal.     Breath sounds: Normal breath sounds.  Abdominal:     General: Bowel sounds are normal.  Musculoskeletal:     Right shoulder: Tenderness present. Decreased range of motion.  Neurological:     General: No focal deficit present.     Mental Status: She is alert and oriented to person, place, and time.     No results found for any visits on 07/04/22.      Assessment & Plan:  Patient presents with right shoulder pain, symptoms present in the  past 6 to 12 hours from the fall.  Patient reports pain is severe with limited range of motion.  Started patient on Robaxin 500 mg tablet by mouth as needed for musculoskeletal pain, ice, rest joint, apply sling in clinic.  Depo-Medrol 40 shot given in clinic.  6-day prednisone by mouth.  Tylenol for pain as needed  Follow-up with worsening unresolved symptoms. Problem List Items Addressed This Visit   None Visit Diagnoses     Acute pain of right shoulder    -  Primary       No orders of the defined types were placed in this encounter.   Return if symptoms worsen or fail to improve.  Daryll Drown, NP

## 2022-07-24 ENCOUNTER — Telehealth: Payer: Self-pay | Admitting: Family Medicine

## 2022-07-24 ENCOUNTER — Other Ambulatory Visit: Payer: Self-pay | Admitting: Family Medicine

## 2022-07-24 MED ORDER — LANSOPRAZOLE 30 MG PO CPDR: 30.0000 mg | DELAYED_RELEASE_CAPSULE | Freq: Two times a day (BID) | ORAL | 3 refills | Status: DC

## 2022-07-24 NOTE — Telephone Encounter (Signed)
Pt aware- needed sent to [facility]

## 2022-07-24 NOTE — Telephone Encounter (Signed)
Switch to prevacid and take twice a day on an empty stomach. Scrip sent to mail order

## 2022-07-25 NOTE — Telephone Encounter (Signed)
Pt called today says that prevacid needs PA. In the meantime, pt is asking what can take? Pt says that stomach pain is unbearable. Please call back

## 2022-07-31 NOTE — Telephone Encounter (Signed)
Called pt--she cannot wait till apt time in November. I offered her next available per [employer] which is Oct 12. Please call back to get her in sooner.

## 2022-07-31 NOTE — Telephone Encounter (Signed)
Called patient, not available, left message to return call 

## 2022-07-31 NOTE — Telephone Encounter (Signed)
Patient returning call.

## 2022-07-31 NOTE — Telephone Encounter (Signed)
Pt. Needs to be seen for this. Thanks, WS 

## 2022-08-01 ENCOUNTER — Ambulatory Visit: Payer: 59 | Admitting: Family Medicine

## 2022-08-01 NOTE — Telephone Encounter (Signed)
Called patient, no answer 

## 2022-08-02 ENCOUNTER — Ambulatory Visit: Payer: 59

## 2022-08-07 NOTE — Telephone Encounter (Signed)
Patient states medication was changed to Prevacid

## 2022-08-14 ENCOUNTER — Telehealth: Payer: Self-pay | Admitting: Family

## 2022-08-14 ENCOUNTER — Encounter: Payer: Self-pay | Admitting: Family

## 2022-08-14 ENCOUNTER — Ambulatory Visit (INDEPENDENT_AMBULATORY_CARE_PROVIDER_SITE_OTHER): Payer: 59 | Admitting: Family

## 2022-08-14 ENCOUNTER — Other Ambulatory Visit: Payer: Self-pay | Admitting: Family Medicine

## 2022-08-14 VITALS — BP 121/74 | HR 64 | Temp 97.3°F | Ht 64.0 in | Wt 187.4 lb

## 2022-08-14 DIAGNOSIS — Z90710 Acquired absence of both cervix and uterus: Secondary | ICD-10-CM

## 2022-08-14 DIAGNOSIS — R300 Dysuria: Secondary | ICD-10-CM

## 2022-08-14 DIAGNOSIS — N8110 Cystocele, unspecified: Secondary | ICD-10-CM

## 2022-08-14 DIAGNOSIS — N814 Uterovaginal prolapse, unspecified: Secondary | ICD-10-CM | POA: Diagnosis not present

## 2022-08-14 NOTE — Progress Notes (Signed)
   Subjective:    Patient ID: Jennifer Morton, female    DOB: 09-15-1961, 61 y.o.   MRN: [id number]  Chief Complaint  Patient presents with   bladdder fall    HPI PT presents to the office today with complaints of a bladder prolapse. States this is the third time this has happened and had to have a bladder tack. She has tried pessary without success. Reports mild pain and irration. States it is worse when she uses the bathroom, coughing, and walking.    She had a hysterectomy at age 64.   Review of Systems  Constitutional: Negative.   HENT: Negative.    Eyes: Negative.   Respiratory: Negative.  Negative for shortness of breath.   Cardiovascular: Negative.  Negative for palpitations.  Gastrointestinal: Negative.   Endocrine: Negative.   Genitourinary: Negative.   Musculoskeletal: Negative.   Neurological: Negative.  Negative for headaches.  Hematological: Negative.   Psychiatric/Behavioral: Negative.    All other systems reviewed and are negative.      Objective:   Physical Exam Vitals reviewed.  Constitutional:      General: She is not in acute distress.    Appearance: She is well-developed.  HENT:     Head: Normocephalic and atraumatic.  Eyes:     Pupils: Pupils are equal, round, and reactive to light.  Neck:     Thyroid: No thyromegaly.  Cardiovascular:     Rate and Rhythm: Normal rate and regular rhythm.     Heart sounds: Normal heart sounds. No murmur heard. Pulmonary:     Effort: Pulmonary effort is normal. No respiratory distress.     Breath sounds: Normal breath sounds. No wheezing.  Abdominal:     General: Bowel sounds are normal. There is no distension.     Palpations: Abdomen is soft.     Tenderness: There is no abdominal tenderness.  Musculoskeletal:        General: No tenderness. Normal range of motion.     Cervical back: Normal range of motion and neck supple.  Skin:    General: Skin is warm and dry.  Neurological:     Mental Status: She is alert  and oriented to person, place, and time.     Cranial Nerves: No cranial nerve deficit.     Deep Tendon Reflexes: Reflexes are normal and symmetric.  Psychiatric:        Behavior: Behavior normal.        Thought Content: Thought content normal.        Judgment: Judgment normal.      BP 121/74   Pulse 64   Temp (!) 97.3 F (36.3 C) (Temporal)   Ht 5\' 4"  (1.626 m)   Wt 187 lb 6.4 oz (85 kg)   BMI 32.17 kg/m       Assessment & Plan:  Jennifer Morton comes in today with chief complaint of bladdder fall   Diagnosis and orders addressed:  1. Cystocele with prolapse - Ambulatory referral to Urology  2. Prolapse of female bladder, acquired - Ambulatory referral to Urology  3. H/O: hysterectomy - Ambulatory referral to Urology  Referral pending  Pelvic floor exercises Keep follow up with PCP   Evelina Dun, FNP

## 2022-08-14 NOTE — Telephone Encounter (Signed)
Patient aware and verbalized understanding. °

## 2022-08-14 NOTE — Patient Instructions (Signed)

## 2022-08-14 NOTE — Telephone Encounter (Signed)
Pt called stating that she had a visit with Jennifer Morton today and said after she left her appt she started getting this burning feeling in her private area and an urgency to use the bathroom. Believes she has a UTI. Wants to know if Alyse Low can send medicine in for her?

## 2022-08-14 NOTE — Telephone Encounter (Signed)
Have her come back and leave urine sample please.

## 2022-08-15 ENCOUNTER — Other Ambulatory Visit: Payer: 59

## 2022-08-15 DIAGNOSIS — R300 Dysuria: Secondary | ICD-10-CM | POA: Diagnosis not present

## 2022-08-15 LAB — URINALYSIS, COMPLETE
Bilirubin, UA: NEGATIVE
Glucose, UA: NEGATIVE
Ketones, UA: NEGATIVE
Leukocytes,UA: NEGATIVE
Nitrite, UA: NEGATIVE
Protein,UA: NEGATIVE
RBC, UA: NEGATIVE
Specific Gravity, UA: 1.015 (ref 1.005–1.030)
Urobilinogen, Ur: 0.2 mg/dL (ref 0.2–1.0)
pH, UA: 6 (ref 5.0–7.5)

## 2022-08-15 LAB — MICROSCOPIC EXAMINATION
Bacteria, UA: NONE SEEN
Epithelial Cells (non renal): NONE SEEN /hpf (ref 0–10)
RBC, Urine: NONE SEEN /hpf (ref 0–2)
Renal Epithel, UA: NONE SEEN /hpf
WBC, UA: NONE SEEN /hpf (ref 0–5)

## 2022-08-17 LAB — URINE CULTURE

## 2022-08-18 ENCOUNTER — Telehealth: Payer: Self-pay | Admitting: Family Medicine

## 2022-08-18 NOTE — Telephone Encounter (Signed)
Patient calling to see if something can be called in for the pain in her back from her bladder dropping. She was seen on 10/2. Please call back and advise.

## 2022-08-22 NOTE — Telephone Encounter (Signed)
Pt. Needs to be seen for this. Thanks, WS 

## 2022-08-24 NOTE — Telephone Encounter (Signed)
She can take tylenol as needed. Does she already have an appointment with Urologists?

## 2022-08-25 MED ORDER — ESTRADIOL 0.1 MG/GM VA CREA: 1.0000 | TOPICAL_CREAM | Freq: Every day | VAGINAL | 12 refills | Status: DC

## 2022-08-25 NOTE — Telephone Encounter (Signed)
Patient aware and verbalized understanding. °

## 2022-08-25 NOTE — Telephone Encounter (Signed)
I have sent in estrace cream that she will use daily, keep Urologists appointment.

## 2022-08-25 NOTE — Addendum Note (Signed)
Addended by: Evelina Dun A on: 08/25/2022 12:45 PM   Modules accepted: Orders

## 2022-08-25 NOTE — Telephone Encounter (Signed)
Patient aware and verbalized understanding. She has an appt 10/25 with them the pain is really bad she is taken tylenol and ibu. It is not helping with pain at all.

## 2022-09-06 DIAGNOSIS — N3946 Mixed incontinence: Secondary | ICD-10-CM | POA: Diagnosis not present

## 2022-09-06 DIAGNOSIS — N8111 Cystocele, midline: Secondary | ICD-10-CM | POA: Diagnosis not present

## 2022-09-11 ENCOUNTER — Telehealth: Payer: Self-pay | Admitting: Family Medicine

## 2022-09-11 ENCOUNTER — Other Ambulatory Visit: Payer: Self-pay | Admitting: Family Medicine

## 2022-09-11 MED ORDER — LANSOPRAZOLE 30 MG PO CPDR: 30.0000 mg | DELAYED_RELEASE_CAPSULE | Freq: Every day | ORAL | 3 refills | Status: DC

## 2022-09-11 NOTE — Telephone Encounter (Signed)
Patient said she only takes lansoprazole (PREVACID) 30 MG capsule once a day and her insurance will not pay since it was called in for twice a day. Please call back to let her know what she needs to do.

## 2022-09-11 NOTE — Telephone Encounter (Signed)
No answer, no voicemail.

## 2022-09-11 NOTE — Telephone Encounter (Signed)
I changed the med back to one a day. Let me know how that works out.

## 2022-09-12 NOTE — Telephone Encounter (Signed)
Patient aware.

## 2022-10-12 ENCOUNTER — Other Ambulatory Visit (HOSPITAL_COMMUNITY): Payer: Self-pay

## 2022-10-12 ENCOUNTER — Telehealth: Payer: Self-pay

## 2022-10-12 ENCOUNTER — Ambulatory Visit (INDEPENDENT_AMBULATORY_CARE_PROVIDER_SITE_OTHER): Payer: 59 | Admitting: Family Medicine

## 2022-10-12 ENCOUNTER — Encounter: Payer: Self-pay | Admitting: Family Medicine

## 2022-10-12 VITALS — BP 120/71 | HR 74 | Temp 97.9°F | Ht 64.0 in | Wt 188.8 lb

## 2022-10-12 DIAGNOSIS — E785 Hyperlipidemia, unspecified: Secondary | ICD-10-CM

## 2022-10-12 DIAGNOSIS — N8110 Cystocele, unspecified: Secondary | ICD-10-CM

## 2022-10-12 DIAGNOSIS — M4126 Other idiopathic scoliosis, lumbar region: Secondary | ICD-10-CM | POA: Diagnosis not present

## 2022-10-12 DIAGNOSIS — M4716 Other spondylosis with myelopathy, lumbar region: Secondary | ICD-10-CM

## 2022-10-12 DIAGNOSIS — K2100 Gastro-esophageal reflux disease with esophagitis, without bleeding: Secondary | ICD-10-CM

## 2022-10-12 MED ORDER — CELECOXIB 400 MG PO CAPS: 400.0000 mg | ORAL_CAPSULE | Freq: Every day | ORAL | 1 refills | Status: DC

## 2022-10-12 NOTE — Telephone Encounter (Signed)
Pharmacy Patient Advocate Encounter   Received notification from [facility] that prior authorization for Celecoxib 400mg  caps is required/requested.  PA submitted on 10/12/22 to [facility] via CoverMyMeds  Key  [id number] PA Case ID: [facility]  Status is pending

## 2022-10-12 NOTE — Addendum Note (Signed)
Addended by: Mechele Claude on: 10/12/2022 02:35 PM   Modules accepted: Orders

## 2022-10-12 NOTE — Progress Notes (Addendum)
Subjective:  Patient ID: Jennifer Morton, female    DOB: 1961-03-15  Age: 61 y.o. MRN: [id number]  CC: Medical Management of Chronic Issues   HPI KINDRED HEYING presents for  in for follow-up of elevated cholesterol. Doing well without complaints on current medication. Denies side effects of statin including myalgia and arthralgia and nausea. Currently no chest pain, shortness of breath or other cardiovascular related symptoms noted.  Patient in for follow-up of GERD. Currently asymptomatic taking  PPI daily. There is no chest pain or heartburn. No hematemesis and no melena. No dysphagia or choking. Onset is remote. Progression is stable. Complicating factors, none.  Spondylosis and scoliosis causing back pain causing posterior thigh and buttocks pain bilaterally.   Working with urology for bladder dropping. Prolapsed into vagina.       10/12/2022    1:45 PM 04/12/2022    1:13 PM  Depression screen PHQ 2/9  Decreased Interest 0 0  Down, Depressed, Hopeless 0 0  PHQ - 2 Score 0 0    History Legacy has a past medical history of Asthma, GERD (gastroesophageal reflux disease), Hyperlipidemia, and MI (myocardial infarction) ([place]).   She has a past surgical history that includes Abdominal hysterectomy; Cholecystectomy; and Bladder surgery (2017).   Her family history includes COPD in her father; Cancer in her father, maternal uncle, maternal uncle, and maternal uncle; Diabetes in her father; Hearing loss in her daughter; Heart disease in her mother; Heart failure in her mother; Hyperlipidemia in her mother; Hypertension in her mother.She reports that she has never smoked. Her smokeless tobacco use includes snuff. She reports that she does not drink alcohol and does not use drugs.    ROS Review of Systems  Constitutional: Negative.   HENT: Negative.    Eyes:  Negative for visual disturbance.  Respiratory:  Negative for shortness of breath.   Cardiovascular:  Negative for chest pain.   Gastrointestinal:  Negative for abdominal pain.  Musculoskeletal:  Negative for arthralgias.    Objective:  BP 120/71   Pulse 74   Temp 97.9 F (36.6 C)   Ht _0  (1.626 m)   Wt 188 lb 12.8 oz (85.6 kg)   SpO2 96%   BMI 32.41 kg/m   BP Readings from Last 3 Encounters:  10/12/22 120/71  08/14/22 121/74  07/04/22 124/86    Wt Readings from Last 3 Encounters:  10/12/22 188 lb 12.8 oz (85.6 kg)  08/14/22 187 lb 6.4 oz (85 kg)  07/04/22 186 lb (84.4 kg)     Physical Exam Constitutional:      General: She is not in acute distress.    Appearance: She is well-developed.  Cardiovascular:     Rate and Rhythm: Normal rate and regular rhythm.  Pulmonary:     Breath sounds: Normal breath sounds.  Musculoskeletal:        General: Normal range of motion.  Skin:    General: Skin is warm and dry.  Neurological:     Mental Status: She is alert and oriented to person, place, and time.       Assessment & Plan:   Jarvis was seen today for medical management of chronic issues.  Diagnoses and all orders for this visit:  Hyperlipidemia, unspecified hyperlipidemia type -     CBC with Differential/Platelet -     CMP14+EGFR -     Lipid panel  Other idiopathic scoliosis, lumbar region -     celecoxib (CELEBREX) 400 MG capsule; Take 1  capsule (400 mg total) by mouth daily. With food -     Ambulatory referral to Physical Therapy -     CBC with Differential/Platelet -     CMP14+EGFR -     Lipid panel  Prolapse of female bladder, acquired -     CBC with Differential/Platelet -     CMP14+EGFR -     Lipid panel  Lumbar spondylosis with myelopathy -     Ambulatory referral to Physical Therapy -     CBC with Differential/Platelet -     CMP14+EGFR -     Lipid panel  Gastroesophageal reflux disease with esophagitis without hemorrhage -     CBC with Differential/Platelet -     CMP14+EGFR -     Lipid panel       I have discontinued Keshia F. Zubiate's methocarbamol,  diclofenac Sodium, and estradiol. I have also changed her celecoxib. Additionally, I am having her maintain her atorvastatin, cetirizine, albuterol, and lansoprazole.  Allergies as of 10/12/2022       Reactions   Penicillins Rash   Did it involve swelling of the face/tongue/throat, SOB, or low BP? Yes-facial swelling Did it involve sudden or severe rash/hives, skin peeling, or any reaction on the inside of your mouth or nose? Fayrene Fearing to back/stomach Did you need to seek medical attention at a hospital or doctor's office? Yes When did it last happen?      10 years prior If all above answers are "NO", may proceed with cephalosporin use.   Prednisone Rash   "can take the shot but cannot take the pills"        Medication List        Accurate as of October 12, 2022  2:35 PM. If you have any questions, ask your nurse or doctor.          STOP taking these medications    diclofenac Sodium 1 % Gel Commonly known as: VOLTAREN Stopped by: Claretta Fraise, MD   estradiol 0.1 MG/GM vaginal cream Commonly known as: ESTRACE VAGINAL Stopped by: Claretta Fraise, MD   methocarbamol 500 MG tablet Commonly known as: ROBAXIN Stopped by: Claretta Fraise, MD       TAKE these medications    albuterol 108 (90 Base) MCG/ACT inhaler Commonly known as: VENTOLIN HFA Inhale 2 puffs into the lungs every 6 (six) hours as needed for wheezing or shortness of breath.   atorvastatin 80 MG tablet Commonly known as: LIPITOR Take 1 tablet (80 mg total) by mouth daily.   celecoxib 400 MG capsule Commonly known as: CeleBREX Take 1 capsule (400 mg total) by mouth daily. With food What changed:  medication strength how much to take Changed by: Claretta Fraise, MD   cetirizine 10 MG tablet Commonly known as: ZYRTEC TAKE 1 Tablet BY MOUTH ONCE EVERY DAY AS NEEDED FOR ALLERGIES   lansoprazole 30 MG capsule Commonly known as: PREVACID Take 1 capsule (30 mg total) by mouth daily. With an empty  stomach         Follow-up: Return in about 6 months (around 04/12/2023), or if symptoms worsen or fail to improve.  Claretta Fraise, M.D.

## 2022-10-13 LAB — CBC WITH DIFFERENTIAL/PLATELET
Basophils Absolute: 0.1 10*3/uL (ref 0.0–0.2)
Basos: 1 %
EOS (ABSOLUTE): 0.2 10*3/uL (ref 0.0–0.4)
Eos: 3 %
Hematocrit: 44.4 % (ref 34.0–46.6)
Hemoglobin: 14.6 g/dL (ref 11.1–15.9)
Immature Grans (Abs): 0 10*3/uL (ref 0.0–0.1)
Immature Granulocytes: 0 %
Lymphocytes Absolute: 1.7 10*3/uL (ref 0.7–3.1)
Lymphs: 25 %
MCH: 27.7 pg (ref 26.6–33.0)
MCHC: 32.9 g/dL (ref 31.5–35.7)
MCV: 84 fL (ref 79–97)
Monocytes Absolute: 0.6 10*3/uL (ref 0.1–0.9)
Monocytes: 8 %
Neutrophils Absolute: 4.3 10*3/uL (ref 1.4–7.0)
Neutrophils: 63 %
Platelets: 283 10*3/uL (ref 150–450)
RBC: 5.27 x10E6/uL (ref 3.77–5.28)
RDW: 13.7 % (ref 11.7–15.4)
WBC: 6.8 10*3/uL (ref 3.4–10.8)

## 2022-10-13 LAB — CMP14+EGFR
ALT: 19 IU/L (ref 0–32)
AST: 22 IU/L (ref 0–40)
Albumin/Globulin Ratio: 1.6 (ref 1.2–2.2)
Albumin: 4.2 g/dL (ref 3.9–4.9)
Alkaline Phosphatase: 127 IU/L — ABNORMAL HIGH (ref 44–121)
BUN/Creatinine Ratio: 21 (ref 12–28)
BUN: 16 mg/dL (ref 8–27)
Bilirubin Total: 1.1 mg/dL (ref 0.0–1.2)
CO2: 22 mmol/L (ref 20–29)
Calcium: 9.2 mg/dL (ref 8.7–10.3)
Chloride: 104 mmol/L (ref 96–106)
Creatinine, Ser: 0.75 mg/dL (ref 0.57–1.00)
Globulin, Total: 2.7 g/dL (ref 1.5–4.5)
Glucose: 91 mg/dL (ref 70–99)
Potassium: 4.3 mmol/L (ref 3.5–5.2)
Sodium: 140 mmol/L (ref 134–144)
Total Protein: 6.9 g/dL (ref 6.0–8.5)
eGFR: 89 mL/min/{1.73_m2} (ref >59)

## 2022-10-13 LAB — LIPID PANEL
Chol/HDL Ratio: 5.2 ratio — ABNORMAL HIGH (ref 0.0–4.4)
Cholesterol, Total: 293 mg/dL — ABNORMAL HIGH (ref 100–199)
HDL: 56 mg/dL (ref >39)
LDL Chol Calc (NIH): 195 mg/dL — ABNORMAL HIGH (ref 0–99)
Triglycerides: 221 mg/dL — ABNORMAL HIGH (ref 0–149)
VLDL Cholesterol Cal: 42 mg/dL — ABNORMAL HIGH (ref 5–40)

## 2022-10-13 NOTE — Telephone Encounter (Signed)
Jennifer Morton Key: [id number] - PA Case ID: [id number] - Rx #: [id number] Need help? Call us at [phone] Status Sent to [facility] November 30 Drug Celecoxib 400MG  capsules

## 2022-10-14 ENCOUNTER — Other Ambulatory Visit (HOSPITAL_COMMUNITY): Payer: Self-pay

## 2022-10-16 ENCOUNTER — Other Ambulatory Visit (HOSPITAL_COMMUNITY): Payer: Self-pay

## 2022-10-16 MED ORDER — EZETIMIBE 10 MG PO TABS: 10.0000 mg | ORAL_TABLET | Freq: Every day | ORAL | 3 refills | Status: DC

## 2022-10-16 NOTE — Addendum Note (Signed)
Addended by: Mechele Claude on: 10/16/2022 06:08 PM   Modules accepted: Orders

## 2022-10-16 NOTE — Telephone Encounter (Signed)
Patient aware.

## 2022-10-16 NOTE — Telephone Encounter (Signed)
Pharmacy Patient Advocate Encounter  Prior Authorization for Celecoxib 400MG  has been approved.    PA# Effective dates: 10/13/2022 through 10/14/2023

## 2022-10-17 ENCOUNTER — Telehealth: Payer: Self-pay

## 2022-10-17 ENCOUNTER — Other Ambulatory Visit (HOSPITAL_COMMUNITY): Payer: Self-pay

## 2022-10-17 ENCOUNTER — Telehealth: Payer: Self-pay | Admitting: Family Medicine

## 2022-10-17 NOTE — Telephone Encounter (Signed)
Patient Advocate Encounter   Received notification from [employer] that prior authorization for Lansoprazole 30MG  DR is required.   PA submitted on 10/17/2022 Key [id number] Status is pending       14/03/2022, [facility] Pharmacy Patient Advocate Specialist [facility] Patient Advocate Team Direct Number: [phone] Fax: [phone]

## 2022-10-17 NOTE — Telephone Encounter (Signed)
Pharmacy Patient Advocate Encounter  Prior Authorization for Lansoprazole 30MG  dr has been approved.    PA# Effective dates: 10/17/2022 through 10/17/2023

## 2022-10-17 NOTE — Telephone Encounter (Signed)
Prior auth has been approved

## 2022-10-17 NOTE — Telephone Encounter (Signed)
Pt called to let us know that her Prevacid Rx is requiring PA.  Can call [phone] if needed.

## 2022-10-27 DIAGNOSIS — N3946 Mixed incontinence: Secondary | ICD-10-CM | POA: Diagnosis not present

## 2022-10-27 DIAGNOSIS — R351 Nocturia: Secondary | ICD-10-CM | POA: Diagnosis not present

## 2022-10-27 DIAGNOSIS — N390 Urinary tract infection, site not specified: Secondary | ICD-10-CM | POA: Diagnosis not present

## 2022-10-27 DIAGNOSIS — N8111 Cystocele, midline: Secondary | ICD-10-CM | POA: Diagnosis not present

## 2022-10-27 DIAGNOSIS — R350 Frequency of micturition: Secondary | ICD-10-CM | POA: Diagnosis not present

## 2022-11-17 DIAGNOSIS — R350 Frequency of micturition: Secondary | ICD-10-CM | POA: Diagnosis not present

## 2022-12-19 ENCOUNTER — Encounter: Payer: Self-pay | Admitting: Family Medicine

## 2023-04-12 ENCOUNTER — Ambulatory Visit: Payer: 59 | Admitting: Family Medicine

## 2023-04-12 ENCOUNTER — Encounter: Payer: Self-pay | Admitting: Family Medicine

## 2023-05-15 ENCOUNTER — Encounter: Payer: Self-pay | Admitting: Urology

## 2023-05-15 ENCOUNTER — Ambulatory Visit (INDEPENDENT_AMBULATORY_CARE_PROVIDER_SITE_OTHER): Payer: 59 | Admitting: Urology

## 2023-05-15 VITALS — BP 141/86 | HR 77 | Ht 64.0 in | Wt 170.0 lb

## 2023-05-15 DIAGNOSIS — N8111 Cystocele, midline: Secondary | ICD-10-CM | POA: Diagnosis not present

## 2023-05-15 DIAGNOSIS — N3946 Mixed incontinence: Secondary | ICD-10-CM

## 2023-05-15 LAB — URINALYSIS, ROUTINE W REFLEX MICROSCOPIC
Bilirubin, UA: NEGATIVE
Glucose, UA: NEGATIVE
Ketones, UA: NEGATIVE
Leukocytes,UA: NEGATIVE
Nitrite, UA: NEGATIVE
Protein,UA: NEGATIVE
RBC, UA: NEGATIVE
Specific Gravity, UA: 1.03 (ref 1.005–1.030)
Urobilinogen, Ur: 0.2 mg/dL (ref 0.2–1.0)
pH, UA: 5.5 (ref 5.0–7.5)

## 2023-05-15 NOTE — Progress Notes (Signed)
Assessment: 1. Cystocele, recurrent   2. Mixed incontinence     Plan: I personally reviewed the patient's chart including provider notes. Given her recurrent cystocele, I recommended that she be evaluated and managed by a pelvic floor reconstruction specialist.  I advised her that I do not perform this type of surgery.  I provided her with names of specialists in the area with contact information. Return to office as needed.   Chief Complaint:  Chief Complaint  Patient presents with   Cystocele    History of Present Illness:  Jennifer Morton is a 62 y.o. female who is seen for evaluation of bladder prolapse.  She has a history of bladder prolapse following her hysterectomy a number of years ago.  She has had 2 prior cystocele repairs.  Apparently, both of these were performed transvaginally.  She currently has a grade 3 cystocele on exam with some hypermobility of the bladder neck.  She has previously tried a pessary without benefit.  She notes a significant vaginal bulge and reduces it manually.  She does have occasional incontinence, primarily at night.  She is using pads on a regular basis.  She also reports urinary frequency and urgency.  No recent UTIs.  She has been seen at [facility] by Dr. Sherron Monday and evaluated with urodynamics.  Urodynamic study showed a bladder capacity of 300 mL without evidence of stress incontinence or bladder instability.  She was able to void to completion.  The bladder descendent approximately 4-5 cm with cough and Valsalva.  No reflux was seen.  She was unable to continue her care at [facility] due to her insurance coverage.  She was referred here by her insurance provider for further evaluation.   Past Medical History:  Past Medical History:  Diagnosis Date   Asthma    GERD (gastroesophageal reflux disease)    Hyperlipidemia    MI (myocardial infarction) [facility])     Past Surgical History:  Past Surgical History:  Procedure Laterality  Date   ABDOMINAL HYSTERECTOMY     BLADDER SURGERY  2017   bladder tack   CHOLECYSTECTOMY      Allergies:  Allergies  Allergen Reactions   Penicillins Rash    Did it involve swelling of the face/tongue/throat, SOB, or low BP? Yes-facial swelling Did it involve sudden or severe rash/hives, skin peeling, or any reaction on the inside of your mouth or nose? Diana Eves to back/stomach Did you need to seek medical attention at a hospital or doctor's office? Yes When did it last happen?      10 years prior If all above answers are "NO", may proceed with cephalosporin use.    Prednisone Rash    "can take the shot but cannot take the pills"    Family History:  Family History  Problem Relation Age of Onset   Heart disease Mother    Hypertension Mother    Heart failure Mother    Hyperlipidemia Mother    Cancer Father        colon cancer   Diabetes Father    COPD Father    Hearing loss Daughter    Cancer Maternal Uncle        lung cancer   Cancer Maternal Uncle        liver cancer   Cancer Maternal Uncle        back cancer    Social History:  Social History   Tobacco Use   Smoking status: Never   Smokeless  tobacco: Current    Types: Snuff  Vaping Use   Vaping Use: Never used  Substance Use Topics   Alcohol use: No   Drug use: No    Review of symptoms:  Constitutional:  Negative for unexplained weight loss, night sweats, fever, chills ENT:  Negative for nose bleeds, sinus pain, painful swallowing CV:  Negative for chest pain, shortness of breath, exercise intolerance, palpitations, loss of consciousness Resp:  Negative for cough, wheezing, shortness of breath GI:  Negative for nausea, vomiting, diarrhea, bloody stools GU:  Positives noted in HPI; otherwise negative for gross hematuria, dysuria Neuro:  Negative for seizures, poor balance, limb weakness, slurred speech Psych:  Negative for lack of energy, depression, anxiety Endocrine:  Negative for polydipsia,  polyuria, symptoms of hypoglycemia (dizziness, hunger, sweating) Hematologic:  Negative for anemia, purpura, petechia, prolonged or excessive bleeding, use of anticoagulants  Allergic:  Negative for difficulty breathing or choking as a result of exposure to anything; no shellfish allergy; no allergic response (rash/itch) to materials, foods  Physical exam: BP (!) 141/86   Pulse 77   Ht 5\' 4"  (1.626 m)   Wt 170 lb (77.1 kg)   BMI 29.18 kg/m  GENERAL APPEARANCE:  Well appearing, well developed, well nourished, NAD HEENT: Atraumatic, Normocephalic, oropharynx clear. NECK: Supple without lymphadenopathy or thyromegaly. LUNGS: Clear to auscultation bilaterally. HEART: Regular Rate and Rhythm without murmurs, gallops, or rubs. ABDOMEN: Soft, non-tender, No Masses. EXTREMITIES: Moves all extremities well.  Without clubbing, cyanosis, or edema. NEUROLOGIC:  Alert and oriented x 3, normal gait, CN II-XII grossly intact.  MENTAL STATUS:  Appropriate. BACK:  Non-tender to palpation.  No CVAT SKIN:  Warm, dry and intact.   Pelvic:  deferred today  Results: U/A:  negative

## 2023-05-28 IMAGING — DX DG LUMBAR SPINE COMPLETE 4+V
5 series · 5 of 5 positions shown · non-contrast
Comparison: None.

CLINICAL DATA: Bilateral lower back pain radiating to legs for 2
days

EXAM:
LUMBAR SPINE - COMPLETE 4+ VIEW

[l-spine ap]
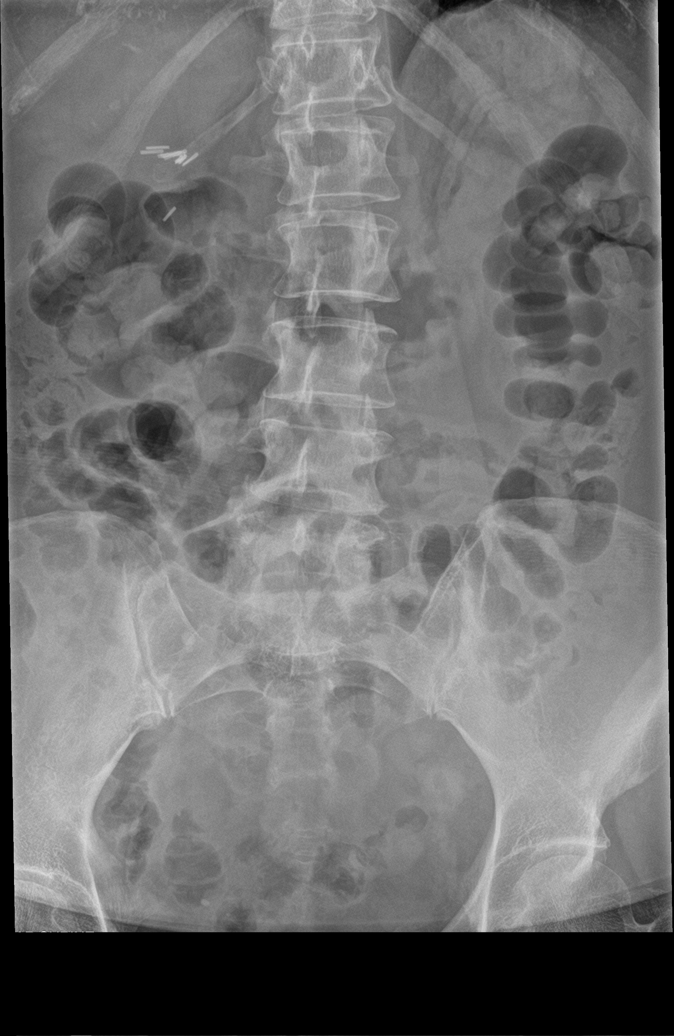

[l-spine obl (1 of 2)]
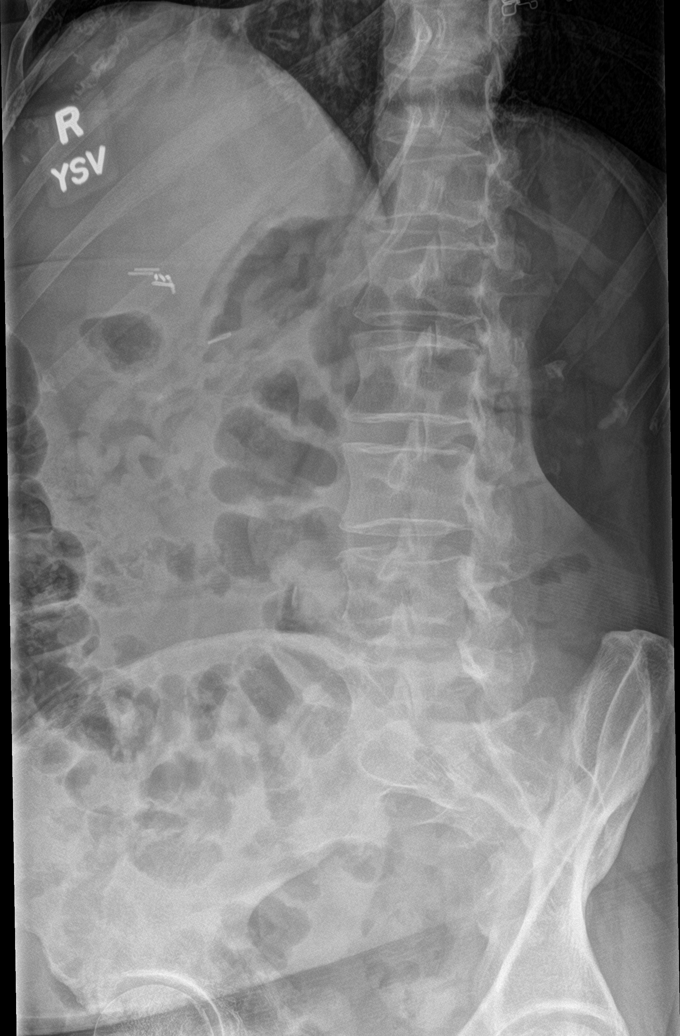

[l-spine obl (2 of 2)]
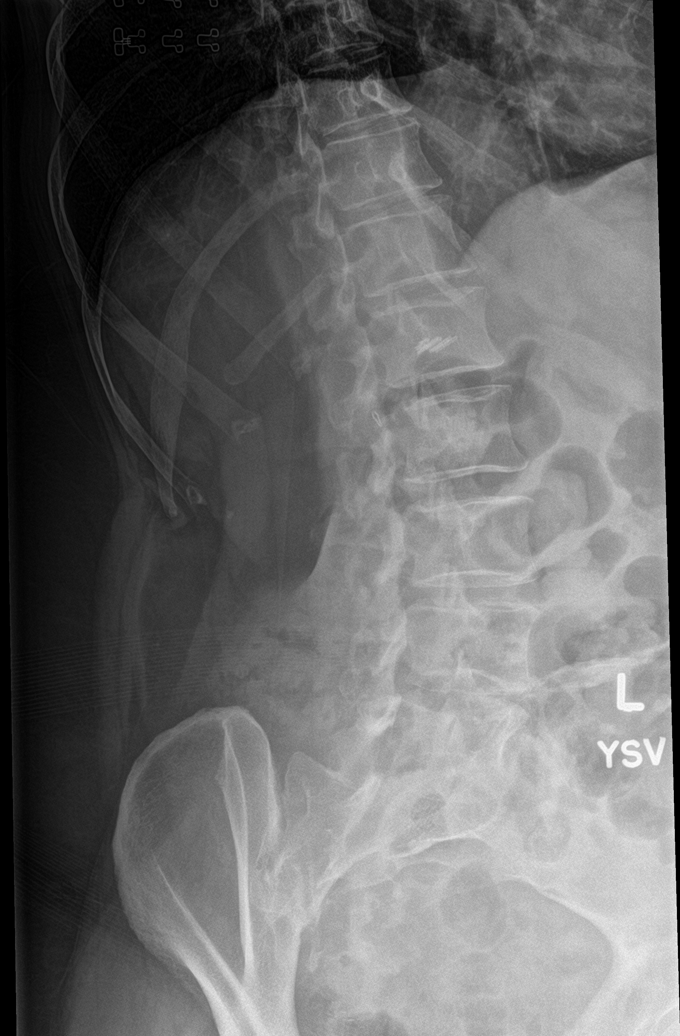

[l-spine lat]
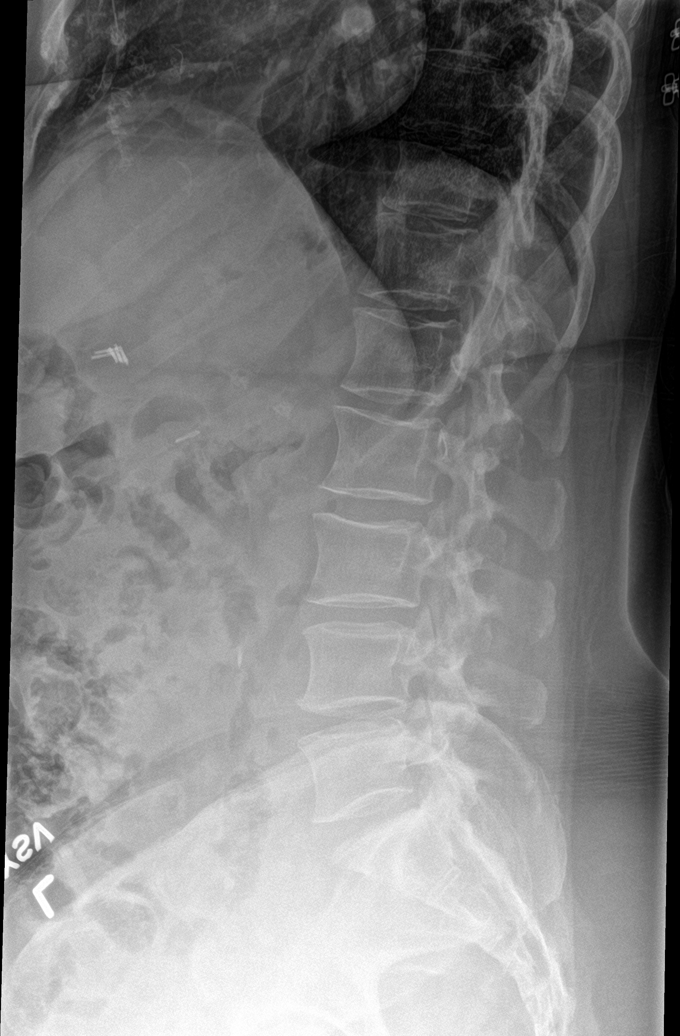

[l-spine spot]
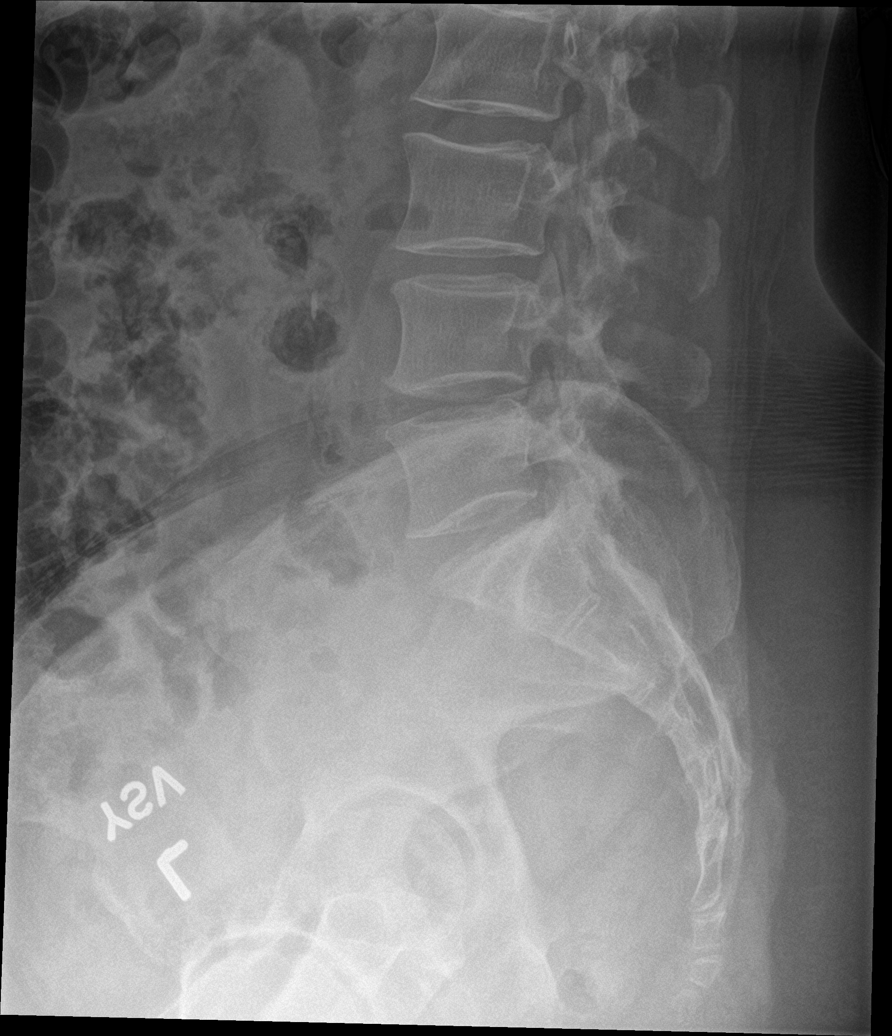

[5 of 5 positions shown; findings below may reference images not displayed]

FINDINGS: Frontal, bilateral oblique, lateral views of the lumbar spine are
obtained. There are 5 non-rib-bearing lumbar type vertebral bodies
with mild left convex scoliosis. No acute fractures. Mild lower
lumbar spondylosis greatest at L4-5. Mild facet hypertrophy greatest
at L5-S1. Sacroiliac joints are normal.
IMPRESSION: 1. Mild lower lumbar spondylosis and facet hypertrophy. No acute
fracture.
2. Mild left convex scoliosis.

## 2023-07-11 ENCOUNTER — Telehealth: Payer: Self-pay

## 2023-07-11 NOTE — Transitions of Care (Post Inpatient/ED Visit) (Signed)
   07/11/2023  Name: Jennifer Morton MRN: [id number] DOB: 01-26-1961  Today's TOC FU Call Status: Today's TOC FU Call Status:: Unsuccessful Call (1st Attempt) Unsuccessful Call (1st Attempt) Date: 07/11/23  Attempted to reach the patient regarding the most recent Inpatient/ED visit.  Follow Up Plan: Additional outreach attempts will be made to reach the patient to complete the Transitions of Care (Post Inpatient/ED visit) call.   Signature Karena Addison, LPN [facility] Nurse Health Advisor Direct Dial [phone]

## 2023-07-23 NOTE — Transitions of Care (Post Inpatient/ED Visit) (Unsigned)
   07/23/2023  Name: Jennifer Morton MRN: [id number] DOB: Mar 22, 1961  Today's TOC FU Call Status: Today's TOC FU Call Status:: Unsuccessful Call (2nd Attempt) Unsuccessful Call (1st Attempt) Date: 07/11/23 Unsuccessful Call (2nd Attempt) Date: 07/23/23  Attempted to reach the patient regarding the most recent Inpatient/ED visit.  Follow Up Plan: Additional outreach attempts will be made to reach the patient to complete the Transitions of Care (Post Inpatient/ED visit) call.   Signature Karena Addison, LPN [facility] Nurse Health Advisor Direct Dial [phone]

## 2023-07-24 NOTE — Transitions of Care (Post Inpatient/ED Visit) (Signed)
   07/24/2023  Name: Jennifer Morton MRN: [id number] DOB: [birth year]  Today's TOC FU Call Status: Today's TOC FU Call Status:: Unsuccessful Call (3rd Attempt) Unsuccessful Call (1st Attempt) Date: 07/11/23 Unsuccessful Call (2nd Attempt) Date: 07/23/23 Unsuccessful Call (3rd Attempt) Date: 07/24/23  Attempted to reach the patient regarding the most recent Inpatient/ED visit.  Follow Up Plan: No further outreach attempts will be made at this time. We have been unable to contact the patient.  Signature Karena Addison, LPN [facility] Nurse Health Advisor Direct Dial [phone]

## 2024-03-31 ENCOUNTER — Other Ambulatory Visit: Payer: Self-pay | Admitting: Family Medicine

## 2024-03-31 NOTE — Telephone Encounter (Signed)
 Stacks NTBS last OV 10/12/22 NO RF sent to pharmacy last OV greater than a year

## 2024-03-31 NOTE — Telephone Encounter (Signed)
 Tried calling pt to schedule appt but NA and NVM.

## 2024-04-14 ENCOUNTER — Ambulatory Visit: Payer: Self-pay

## 2024-04-14 DIAGNOSIS — K5730 Diverticulosis of large intestine without perforation or abscess without bleeding: Secondary | ICD-10-CM | POA: Diagnosis not present

## 2024-04-14 DIAGNOSIS — K8689 Other specified diseases of pancreas: Secondary | ICD-10-CM | POA: Diagnosis not present

## 2024-04-14 DIAGNOSIS — K5750 Diverticulosis of both small and large intestine without perforation or abscess without bleeding: Secondary | ICD-10-CM | POA: Diagnosis not present

## 2024-04-14 DIAGNOSIS — Z880 Allergy status to penicillin: Secondary | ICD-10-CM | POA: Diagnosis not present

## 2024-04-14 DIAGNOSIS — M545 Low back pain, unspecified: Secondary | ICD-10-CM | POA: Diagnosis not present

## 2024-04-14 NOTE — Telephone Encounter (Signed)
  Chief Complaint: kidney/back pain, urniary symptoms  Frequency: x 2 days Pertinent Negatives: Patient denies fever, bloody urine Disposition: [x] ED /[] Urgent Care (no appt availability in office) / [] Appointment(In office/virtual)/ []  [facility]/ [] Home Care/ [] Refused Recommended Disposition /[] [facility]/ []  Follow-up with PCP Additional Notes: patient with history of prolapsed bladder with back/kidney pain. Does not feel like she is emptying when she urinates. Increased urinary frequency Copied from CRM [id number]. Topic: Clinical - Red Word Triage >> Apr 14, 2024 11:06 AM Baldomero Bone wrote: Red Word that prompted transfer to Nurse Triage: back pain where kidneys are. trouble urinating; feels like she has to go bad but little urination comes out. getting worse. Callback number [phone] Reason for Disposition  Urinating more frequently than usual (i.e., frequency)  [1] Unable to urinate (or only a few drops) > 4 hours AND [2] bladder feels very full (e.g., palpable bladder or strong urge to urinate)  Answer Assessment - Initial Assessment Questions 1. SYMPTOM: "What's the main symptom you're concerned about?" (e.g., frequency, incontinence)  Back pain, dysuria, frequency 2. ONSET: "When did the  symptoms  start?"     Two days ago 3. PAIN: "Is there any pain?" If Yes, ask: "How bad is it?" (Scale: 1-10; mild, moderate, severe)     Back 8/10 4. CAUSE: "What do you think is causing the symptoms?"     History of prolapsed bladder 5. OTHER SYMPTOMS: "Do you have any other symptoms?" (e.g., blood in urine, fever, flank pain, pain with urination)     No blood, no fever  Protocols used: Urinary Symptoms-A-AH

## 2024-04-14 NOTE — Telephone Encounter (Signed)
 Spoke to pt and she is currently headed to ED. Will give us  a call if she needs anything else from us .

## 2024-05-06 ENCOUNTER — Emergency Department (HOSPITAL_COMMUNITY)

## 2024-05-06 ENCOUNTER — Other Ambulatory Visit: Payer: Self-pay

## 2024-05-06 ENCOUNTER — Emergency Department (HOSPITAL_COMMUNITY)
Admission: EM | Admit: 2024-05-06 | Discharge: 2024-05-06 | Disposition: A | Discharge status: Home / Self Care | Attending: Emergency Medicine | Admitting: Emergency Medicine

## 2024-05-06 DIAGNOSIS — J45909 Unspecified asthma, uncomplicated: Secondary | ICD-10-CM | POA: Insufficient documentation

## 2024-05-06 DIAGNOSIS — R0789 Other chest pain: Secondary | ICD-10-CM | POA: Insufficient documentation

## 2024-05-06 DIAGNOSIS — R079 Chest pain, unspecified: Secondary | ICD-10-CM | POA: Diagnosis not present

## 2024-05-06 LAB — CBC
HCT: 42.4 % (ref 36.0–46.0)
Hemoglobin: 13.8 g/dL (ref 12.0–15.0)
MCH: 29.3 pg (ref 26.0–34.0)
MCHC: 32.5 g/dL (ref 30.0–36.0)
MCV: 90 fL (ref 80.0–100.0)
Platelets: 315 10*3/uL (ref 150–400)
RBC: 4.71 MIL/uL (ref 3.87–5.11)
RDW: 13.1 % (ref 11.5–15.5)
WBC: 8 10*3/uL (ref 4.0–10.5)
nRBC: 0 % (ref 0.0–0.2)

## 2024-05-06 LAB — BASIC METABOLIC PANEL WITH GFR
Anion gap: 9 (ref 5–15)
BUN: 15 mg/dL (ref 8–23)
CO2: 22 mmol/L (ref 22–32)
Calcium: 8.8 mg/dL — ABNORMAL LOW (ref 8.9–10.3)
Chloride: 107 mmol/L (ref 98–111)
Creatinine, Ser: 0.7 mg/dL (ref 0.44–1.00)
GFR, Estimated: >60 mL/min (ref >60)
Glucose, Bld: 97 mg/dL (ref 70–99)
Potassium: 3.8 mmol/L (ref 3.5–5.1)
Sodium: 138 mmol/L (ref 135–145)

## 2024-05-06 LAB — TROPONIN I (HIGH SENSITIVITY)
Troponin I (High Sensitivity): <2 ng/L (ref <18)
Troponin I (High Sensitivity): <2 ng/L (ref <18)

## 2024-05-06 LAB — D-DIMER, QUANTITATIVE: D-Dimer, Quant: 0.57 ug{FEU}/mL — ABNORMAL HIGH (ref 0.00–0.50)

## 2024-05-06 MED ORDER — ACETAMINOPHEN 325 MG PO TABS
650.0000 mg | ORAL_TABLET | Freq: Once | ORAL | Status: AC
  Administered 2024-05-06: 650 mg via ORAL
  Filled 2024-05-06: qty 2

## 2024-05-06 MED ORDER — SODIUM CHLORIDE 0.9 % IV BOLUS
500.0000 mL | Freq: Once | INTRAVENOUS | Status: AC
  Administered 2024-05-06: 500 mL via INTRAVENOUS

## 2024-05-06 MED ORDER — ONDANSETRON HCL 4 MG/2ML IJ SOLN
4.0000 mg | Freq: Once | INTRAMUSCULAR | Status: AC
  Administered 2024-05-06: 4 mg via INTRAVENOUS
  Filled 2024-05-06: qty 2

## 2024-05-06 MED ORDER — MORPHINE SULFATE (PF) 4 MG/ML IV SOLN
3.0000 mg | Freq: Once | INTRAVENOUS | Status: AC
  Administered 2024-05-06: 3 mg via INTRAVENOUS
  Filled 2024-05-06: qty 1

## 2024-05-06 NOTE — ED Notes (Signed)
 Pt provided ginger ale per request and per PA approval.

## 2024-05-06 NOTE — ED Provider Notes (Signed)
 [place] EMERGENCY DEPARTMENT AT [facility] Provider Note   CSN: [id number] Arrival date & time: 05/06/24  1320     Patient presents with: Chest Pain   Jennifer Morton is a 63 y.o. female.    Chest Pain Associated symptoms: abdominal pain, headache and numbness (focal numbness bilateral cheeks)   Associated symptoms: no back pain, no cough, no dizziness, no fever, no nausea, no shortness of breath, no vomiting and no weakness        Jennifer Morton is a 63 y.o. female past medical history of asthma, GERD, remote history of MI who presents to the Emergency Department complaining of left-sided chest pain that began around 1230 today.  She describes a sharp stabbing pain that is migrating from substernal area to epigastric area to left rib area.  She took 1 full-strength aspirin prior to arrival.  She was given 2 nitroglycerin and route with some relief of chest pain but no resolution.  She also complains of having diffuse headache now since receiving the nitroglycerin.  She also complains of numbness to her bilateral cheeks since onset of her chest pain.  She states she has had similar episodes like this before.  She denies any numbness or weakness to other areas of her face, neck or extremities.  She denies nausea, vomiting, arm pain, jaw pain.  No speech changes, no visual changes.  She has not seen a PCP or cardiology in two years  Prior to Admission medications   Medication Sig Start Date End Date Taking? Authorizing Provider  albuterol  (VENTOLIN  HFA) 108 (90 Base) MCG/ACT inhaler Inhale 2 puffs into the lungs every 6 (six) hours as needed for wheezing or shortness of breath. 04/12/22  Yes Stacks, Butler, MD  atorvastatin  (LIPITOR) 80 MG tablet Take 1 tablet (80 mg total) by mouth daily. 04/12/22  Yes Stacks, Butler, MD  cetirizine  (ZYRTEC ) 10 MG tablet TAKE 1 Tablet BY MOUTH ONCE EVERY DAY AS NEEDED FOR ALLERGIES 04/12/22  Yes Stacks, Butler, MD  ciprofloxacin (CIPRO) 500 MG  tablet Take 500 mg by mouth 2 (two) times daily. 04/26/24  Yes [provider]  omeprazole  (PRILOSEC) 20 MG capsule Take 40 mg by mouth daily.   Yes [provider]  HYDROcodone -acetaminophen  (NORCO/VICODIN) 5-325 MG tablet Take 1 tablet by mouth every 4 (four) hours as needed for moderate pain (pain score 4-6). Patient not taking: Reported on 05/06/2024 04/14/24   [provider]  methocarbamol  (ROBAXIN ) 500 MG tablet Take 500 mg by mouth 2 (two) times daily. Patient not taking: Reported on 05/06/2024 04/14/24   [provider]  naproxen (NAPROSYN) 500 MG tablet Take 500 mg by mouth 2 (two) times daily. Patient not taking: Reported on 05/06/2024 04/14/24   [provider]    Allergies: Penicillins and Prednisone    Review of Systems  Constitutional:  Negative for chills and fever.  Respiratory:  Negative for cough and shortness of breath.   Cardiovascular:  Positive for chest pain.  Gastrointestinal:  Positive for abdominal pain. Negative for diarrhea, nausea and vomiting.  Genitourinary:  Negative for dysuria and flank pain.  Musculoskeletal:  Negative for arthralgias, back pain, myalgias and neck pain.  Neurological:  Positive for numbness (focal numbness bilateral cheeks) and headaches. Negative for dizziness and weakness.  Psychiatric/Behavioral:  Negative for confusion.     Updated Vital Signs BP 128/88   Pulse (!) 58   Temp 98.7 F (37.1 C) (Oral)   Resp 14   Ht 5'  4 (1.626 m)   Wt 77.1 kg   SpO2 97%   BMI 29.18 kg/m   Physical Exam Vitals and nursing note reviewed.  Constitutional:      General: She is not in acute distress.    Appearance: Normal appearance. She is well-developed. She is not ill-appearing or toxic-appearing.  HENT:     Mouth/Throat:     Mouth: Mucous membranes are moist.     Pharynx: Oropharynx is clear.   Eyes:     Extraocular Movements: Extraocular movements intact.     Conjunctiva/sclera: Conjunctivae  normal.    Cardiovascular:     Rate and Rhythm: Normal rate and regular rhythm.     Pulses: Normal pulses.  Pulmonary:     Effort: Pulmonary effort is normal.     Breath sounds: Normal breath sounds.     Comments: Reproducible tenderness to palpation anterior lateral left chest wall. Chest:     Chest wall: Tenderness present.  Abdominal:     General: There is no distension.     Palpations: Abdomen is soft.     Tenderness: There is no abdominal tenderness. There is no guarding.   Musculoskeletal:     Cervical back: Normal range of motion.     Right lower leg: No edema.     Left lower leg: No edema.   Skin:    General: Skin is warm.     Capillary Refill: Capillary refill takes less than 2 seconds.   Neurological:     General: No focal deficit present.     Mental Status: She is alert.     GCS: GCS eye subscore is 4. GCS verbal subscore is 5. GCS motor subscore is 6.     Cranial Nerves: Cranial nerves 2-12 are intact. No dysarthria or facial asymmetry.     Sensory: Sensation is intact. No sensory deficit.     Motor: Motor function is intact. No weakness.     Coordination: Coordination is intact.     Comments: CN II through XII intact.  Speech clear.  No pronator drift.  No lower extremity drift, I do not appreciate any facial droop and sensation of face is intact.  Patient is mentating well.    (all labs ordered are listed, but only abnormal results are displayed) Labs Reviewed  BASIC METABOLIC PANEL WITH GFR - Abnormal; Notable for the following components:      Result Value   Calcium  8.8 (*)    All other components within normal limits  D-DIMER, QUANTITATIVE - Abnormal; Notable for the following components:   D-Dimer, Quant 0.57 (*)    All other components within normal limits  CBC  TROPONIN I (HIGH SENSITIVITY)  TROPONIN I (HIGH SENSITIVITY)    EKG: EKG Interpretation Date/Time:  Tuesday May 06 2024 13:29:05 EDT Ventricular Rate:  55 PR Interval:  204 QRS  Duration:  68 QT Interval:  433 QTC Calculation: 415 R Axis:   43  Text Interpretation: Sinus rhythm Borderline T abnormalities, diffuse leads Confirmed by Suzette Pac [id number]) on 05/06/2024 1:58:24 PM  Radiology: [facility] Chest 2 View Result Date: 05/06/2024 CLINICAL DATA:  Chest pain that radiates to the left side of the ribs. EXAM: CHEST - 2 VIEW COMPARISON:  None Available. FINDINGS: The heart size and mediastinal contours are within normal limits. Very mild atelectatic changes are suspected within the bilateral lung bases. No acute infiltrate, pleural effusion or pneumothorax is identified. The visualized skeletal structures are unremarkable. IMPRESSION: No acute cardiopulmonary disease. Electronically Signed  By: Suzen Dials M.D.   On: 05/06/2024 14:32     Procedures    HEART score 4   Medications Ordered in the ED  morphine  (PF) 4 MG/ML injection 3 mg (has no administration in time range)  ondansetron  (ZOFRAN ) injection 4 mg (has no administration in time range)  acetaminophen  (TYLENOL ) tablet 650 mg (650 mg Oral Given 05/06/24 1442)                                    Medical Decision Making Patient here with history of MI, here with substernal to left-sided chest pain.  Pain is reproducible with palpation.  She reports similar sx's in the past.  Has not seen PCP or cardiologist in few years.  ACS, PE, PUD, doubtful of CVA or thrombus   Amount and/or Complexity of Data Reviewed Labs: ordered.    Details: Labs reassuring.  Delta trop unchanged.  D dimer age adjusted Radiology: ordered.    Details: Cxr w/o acute finding ECG/medicine tests: ordered.    Details: Sinus rhythm, borderline T wave abnml Discussion of management or test interpretation with external provider(s): Pt resting comfortably.  CP and headache improved.  She is requesting discharge home.      Discussed with Dr. Jeffrie with cardiology who will see pt in clinic f/u.  Ambulatory referral placed.  Pt  given return precautions  Risk OTC drugs. Prescription drug management.        Final diagnoses:  Atypical chest pain    ED Discharge Orders     None          Herlinda Milling, PA-C 05/07/24 2324    Melvenia Motto, MD 05/12/24 [phone]

## 2024-05-06 NOTE — ED Triage Notes (Signed)
 Pt arrived [facility] from home with c/o CP that started at 1225 that radiates to the left side of ribs and is sharp. Pt took 324 ASA at home. [facility] gave 2 nitroglycerin in route and gave pt a headache. Pt has 20 G IV to left AC. No n/v or sweating noted.

## 2024-05-06 NOTE — Discharge Instructions (Signed)
 Someone from the cardiology office will likely contact you to arrange follow-up appointment.  Is very important that you keep the appointment.  Return to the emergency department if you develop any new or worsening symptoms

## 2024-05-14 ENCOUNTER — Telehealth: Payer: Self-pay | Admitting: *Deleted

## 2024-05-14 NOTE — Telephone Encounter (Signed)
 Attempted to contact pt to discuss order for coronary CT and instructions.  Called home phone number and female stated she was not home.  He advised to call her on her cell phone.  Attempted to call twice on cell # in chart and received a message stating the call can not be completed at this time.  Will attempt again tomorrow.

## 2024-05-14 NOTE — Telephone Encounter (Signed)
-----   Message from Oneil Parchment sent at 05/14/2024  2:28 PM EDT ----- Regarding: RE: CP at [facility] Let's go ahead and order coronary CT.  Thanks Oneil Parchment, MD ----- Message ----- From: Theotis Sharlet PARAS, RN Sent: 05/14/2024   7:58 AM EDT To: Oneil JAYSON Parchment, MD Subject: RE: CP at [facility]                           Do you want me to go ahead and order the coronary CT or do you need to see her first? Labs good, no allergy, HR 58, no DM  Thanks!  Pam ----- Message ----- From: Parchment Oneil JAYSON, MD Sent: 05/06/2024   6:11 PM EDT To: Sharlet PARAS Theotis, RN Subject: CP at [facility],  Can we work this nice lady from [place] in. Had atypical CP. Will need Coronary CTA. OK to double book.   Oneil Parchment, MD

## 2024-06-06 NOTE — Telephone Encounter (Signed)
 Attempted again to contact pt.  Called phone number listed as home #.  Was informed by a female that pt was not there and to call her at her aunt's of on her call phone.  Advised I do not know her aunt's phone # or the pt's cell phone #.  Female gave a cell phone # for pt of [phone].  Attempted to call.  That call went straight to a voicemail which has not been set up yet.

## 2024-06-19 ENCOUNTER — Encounter: Payer: Self-pay | Admitting: *Deleted

## 2024-06-19 NOTE — Telephone Encounter (Signed)
 Letter mailed to patient's home address to contact office if she is having continued chest pain and wants further testing.

## 2024-07-17 ENCOUNTER — Ambulatory Visit: Payer: Self-pay

## 2024-07-17 DIAGNOSIS — Z888 Allergy status to other drugs, medicaments and biological substances status: Secondary | ICD-10-CM | POA: Diagnosis not present

## 2024-07-17 DIAGNOSIS — H9201 Otalgia, right ear: Secondary | ICD-10-CM | POA: Diagnosis not present

## 2024-07-17 DIAGNOSIS — B379 Candidiasis, unspecified: Secondary | ICD-10-CM | POA: Diagnosis not present

## 2024-07-17 DIAGNOSIS — N3001 Acute cystitis with hematuria: Secondary | ICD-10-CM | POA: Diagnosis not present

## 2024-07-17 DIAGNOSIS — R1031 Right lower quadrant pain: Secondary | ICD-10-CM | POA: Diagnosis not present

## 2024-07-17 DIAGNOSIS — Z880 Allergy status to penicillin: Secondary | ICD-10-CM | POA: Diagnosis not present

## 2024-07-17 DIAGNOSIS — R5381 Other malaise: Secondary | ICD-10-CM | POA: Diagnosis not present

## 2024-07-17 DIAGNOSIS — R112 Nausea with vomiting, unspecified: Secondary | ICD-10-CM | POA: Diagnosis not present

## 2024-07-17 NOTE — Telephone Encounter (Signed)
 FYI Only or Action Required?: FYI only for provider.  Patient was last seen in primary care on 10/12/2022 by Zollie Lowers, MD.  Called Nurse Triage reporting Otalgia.  Symptoms began 2 days ago.  Interventions attempted: Rest, hydration, or home remedies.  Symptoms are: gradually worsening.  Triage Disposition: See HCP Within 4 Hours (Or PCP Triage)  Patient/caregiver understands and will follow disposition?: Yes  Advised patient to go to ED due to weakness        Copied from CRM #[id number]. Topic: Clinical - Red Word Triage >> Jul 17, 2024 11:06 AM Zebedee SAUNDERS wrote: Red Word that prompted transfer to Nurse Triage: Pt is weak, right ear pain, and has diarrhea.        Reason for Disposition  [1] MODERATE weakness (e.g., interferes with work, school, normal activities) AND [2] cause unknown  (Exceptions: Weakness from acute minor illness or poor fluid intake; weakness is chronic and not worse.)  Answer Assessment - Initial Assessment Questions 1. LOCATION: Which ear is involved?     Right ear  2. ONSET: When did the ear pain start?      2 days ago  3. SEVERITY: How bad is the pain?  (Scale 1-10; mild, moderate or severe)     Mild to moderate  4. URI SYMPTOMS: Do you have a runny nose or cough?     Runny nose 5. FEVER: Do you have a fever? If Yes, ask: What is your temperature, how was it measured, and when did it start?     No 6. CAUSE: Have you been swimming recently?, How often do you use Q-TIPS?, Have you had any recent air travel or scuba diving?     Unsure  7. OTHER SYMPTOMS: Do you have any other symptoms? (e.g., decreased hearing, dizziness, headache, stiff neck, vomiting)     Sore throat, diarrhea, I feel weak  Answer Assessment - Initial Assessment Questions 1. DESCRIPTION: Describe how you are feeling.     I feel really weak 2. SEVERITY: How bad is it?  Can you stand and walk?     I'm having a hard time getting up to do  anything 3. ONSET: When did these symptoms begin? (e.g., hours, days, weeks, months)     2 days ago 4. CAUSE: What do you think is causing the weakness or fatigue? (e.g., not drinking enough fluids, medical problem, trouble sleeping)     Unsure  5. NEW MEDICINES:  Have you started on any new medicines recently? (e.g., opioid pain medicines, benzodiazepines, muscle relaxants, antidepressants, antihistamines, neuroleptics, beta blockers)     No 6. OTHER SYMPTOMS: Do you have any other symptoms? (e.g., chest pain, fever, cough, SOB, vomiting, diarrhea, bleeding, other areas of pain)     Ear pain, sore throat, diarrhea  Protocols used: Earache-A-AH, Weakness (Generalized) and Fatigue-A-AH

## 2024-07-17 NOTE — ED Provider Notes (Signed)
 ------------------------------------------------------------------------------- Attestation signed by Rosamond Lyndy Beagle, MD at 07/18/24 [phone] For this patient visit, I have reviewed the APC's documentation, orders, treatment plan, and medical decision making. I agree with the contents.  -------------------------------------------------------------------------------                                                                                     Emergency Department Provider Note    ED Clinical Impression   Final diagnoses:  Acute cystitis with hematuria (Primary)  Right ear pain  Candida infection    ED Assessment/Plan    Condition: Stable Disposition: Discharge  This chart has been completed using Dragon Medical Dictation software, and while attempts have been made to ensure accuracy, certain words and phrases may not be transcribed as intended.   History   Chief Complaint  Patient presents with  . Ear Pain  . Abdominal Pain    Abdominal Pain   Jennifer Morton is a 63 y.o. female to the emergency department for evaluation of general malaise, right ear ache, suprapubic pain.  Patient reports onset beginning few days ago, notes general malaise.  She has associated nausea and vomiting.  No chest pain or shortness of breath, no flank pain.  Patient reports that her urine is cloudy.    Allergies: is allergic to penicillins and prednisone. Medications: has a current medication list which includes the following long-term medication(s): albuterol  and atorvastatin . PMHx:  has no past medical history on file. PSHx:  has no past surgical history on file. SocHx:  reports that she has never smoked. She has never used smokeless tobacco. She reports that she does not drink alcohol and does not use drugs. Allergies, Medications, Medical, Surgical, and Social History were reviewed as documented above.   Social Drivers of Health with Concerns   Food Insecurity: Not on file   Transportation Needs: Not on file  Alcohol Use: Not on file  Housing: Not on file  Physical Activity: Not on file  Utilities: Not on file  Stress: Not on file  Interpersonal Safety: Not on file  Substance Use: Not on file (09/18/2023)  Intimate Partner Violence: Not on file  Social Connections: Not on file  Financial Resource Strain: Not on file  Health Literacy: Not on file  Internet Connectivity: Not on file     Review Of Systems  Review of Systems  Gastrointestinal:  Positive for abdominal pain.    Physical Exam   BP 131/82   Pulse 65   Temp 36.8 C (98.2 F) (Temporal)   Resp 16   Ht 162.6 cm (5' 4)   Wt 76 kg (167 lb 8 oz)   SpO2 97%   BMI 28.75 kg/m   Physical Exam Vitals reviewed.  Constitutional:      General: She is not in acute distress.    Appearance: Normal appearance. She is normal weight. She is not ill-appearing or toxic-appearing.  HENT:     Head: Normocephalic.     Right Ear: Tympanic membrane, ear canal and external ear normal.     Left Ear: Tympanic membrane, ear canal and external ear normal.     Mouth/Throat:     Mouth: Mucous membranes  are moist.  Eyes:     Extraocular Movements: Extraocular movements intact.     Conjunctiva/sclera: Conjunctivae normal.  Cardiovascular:     Rate and Rhythm: Normal rate and regular rhythm.     Heart sounds: Normal heart sounds.  Pulmonary:     Effort: Pulmonary effort is normal.     Breath sounds: Normal breath sounds.  Abdominal:     General: Abdomen is flat. There is no distension.     Palpations: Abdomen is soft.     Tenderness: There is no right CVA tenderness, left CVA tenderness, guarding or rebound.  Musculoskeletal:     Cervical back: Normal range of motion.  Neurological:     General: No focal deficit present.     Mental Status: She is alert and oriented to person, place, and time.  Psychiatric:        Mood and Affect: Mood normal.        Behavior: Behavior normal.     ED Course   Medical Decision Making Ddx: Abdominal pain, viral syndrome, gastroenteritis, biliary colic/gallbladder pathology, pancreatitis, diverticulitis/colitis, obstruction, appendicitis, other acute abdomen; UTI/pyelonephritis, renal stone/ureterolithiasis; otalgia, AOM, otitis externa, foreign body, cerumen impaction, TM perforation, eustachian tube dysfunction  Afebrile, nontoxic-appearing 63 year-old female presents to the emergency department for evaluation of abdominal pain, otalgia.  Initial vitals show blood pressure 131/82, temperature 98.2, heart rate 65, 97% on room air. Physical exam notable for tenderness palpation of the suprapubic region without rebound or guarding.  Abdomen is soft and nonperitoneal, no CVA tenderness. Patient is overall non-toxic, alert and oriented, GCS 15.  Labs notable for mild leukocytosis 10.9 with urine specimen commensurate with UTI.  Suspect is likely etiology for patient's symptoms.  No fever, flank tenderness, vitals are stable here in the emergency department.  Right ear without acute findings, no significant erythema, swelling, TM appears intact without overt perforation.  Will prescribe Macrobid  and Zofran .  Patient given return precautions.  Discussed lab results with patient who is agreeable to discharge at this time.  Discussed at home symptomatic treatment as well as appropriate follow-up.  Discussed symptoms warranting return to the emergency department.  Patient advised return for new or worsening symptoms.  Amount and/or Complexity of Data Reviewed Labs: ordered. Decision-making details documented in ED Course.     Procedures   No results found for this visit on 07/17/24 (from the past 4464 hours).  ED Course as of 07/17/24 1351  Thu Jul 17, 2024  1348 CBC shows leukocytosis 10.9, H&H stable.  No AKI, no gross electrolyte derangements, lipase unremarkable.  Urine specimen does appear consistent with UTI, does also note some yeast.     ED  Results Results for orders placed or performed during the hospital encounter of 07/17/24  Comprehensive Metabolic Panel  Result Value Ref Range   Sodium 142 135 - 145 mmol/L   Potassium 4.0 3.5 - 5.0 mmol/L   Chloride 105 98 - 107 mmol/L   CO2 26.0 21.0 - 32.0 mmol/L   Anion Gap 11 3 - 11 mmol/L   BUN 11 8 - 20 mg/dL   Creatinine 9.32 9.39 - 1.10 mg/dL   BUN/Creatinine Ratio 16    eGFR CKD-EPI (2021) Female >90 >=60 mL/min/1.11m2   Glucose 89 70 - 179 mg/dL   Calcium  8.9 8.5 - 10.1 mg/dL   Albumin 3.6 3.5 - 5.0 g/dL   Total Protein 7.7 6.0 - 8.0 g/dL   Total Bilirubin 0.9 0.3 - 1.2 mg/dL   AST 12 (  L) 15 - 40 U/L   ALT 17 12 - 78 U/L   Alkaline Phosphatase 119 (H) 46 - 116 U/L  Lipase Level  Result Value Ref Range   Lipase 33 16 - 77 U/L  Urinalysis with Microscopy  Result Value Ref Range   Color, UA Light Orange    Clarity, UA Turbid (A) Clear   Specific Gravity, UA 1.017 1.010 - 1.025   pH, UA 5.5 5.0 - 8.0   Leukocyte Esterase, UA Large (A) Negative   Nitrite, UA Positive (A) Negative   Protein, UA Trace (A) Negative   Glucose, UA Negative Negative, Trace   Ketones, UA Negative Negative   Urobilinogen, UA <2.0 mg/dL <7.9 mg/dL   Bilirubin, UA Negative Negative   Blood, UA Moderate (A) Negative   RBC, UA 35 (H) 0 - 3 /HPF   WBC, UA 514 (H) 0 - 3 /HPF   WBC Clumps None Seen None Seen /HPF   Squam Epithel, UA 5 0 - 10 /HPF   Yeast, UA Moderate (A) None Seen /HPF   Bacteria, UA Occasional (A) None Seen /HPF   Hyphal Yeast None Seen None Seen /HPF  CBC w/ Differential  Result Value Ref Range   WBC 10.9 (H) 4.0 - 10.5 10*9/L   RBC 5.03 3.80 - 5.10 10*12/L   HGB 14.4 11.5 - 15.0 g/dL   HCT 56.8 65.9 - 55.9 %   MCV 85.7 80.0 - 98.0 fL   MCH 28.6 27.0 - 34.0 pg   MCHC 33.4 32.0 - 36.0 g/dL   RDW 86.5 88.4 - 85.4 %   MPV 10.2 7.4 - 10.4 fL   Platelet 353 140 - 415 10*9/L   Neutrophils % 67.8 %   Lymphocytes % 23.5 %   Monocytes % 6.2 %   Eosinophils % 1.5 %    Basophils % 0.7 %   Absolute Neutrophils 7.4 1.8 - 7.8 10*9/L   Absolute Lymphocytes 2.6 0.7 - 4.5 10*9/L   Absolute Monocytes 0.7 0.1 - 1.0 10*9/L   Absolute Eosinophils 0.2 0.0 - 0.4 10*9/L   Absolute Basophils 0.1 0.0 - 0.2 10*9/L   No results found.  Medications Administered: Medications - No data to display  Discharge Medications (Medications Prescribed during this  ED visit and Patient's Home Medications) :    Your Medication List     START taking these medications    fluconazole 150 MG tablet Commonly known as: DIFLUCAN Take 1 tablet (150 mg total) by mouth once for 1 dose.   nitrofurantoin  (macrocrystal-monohydrate) 100 MG capsule Commonly known as: MACROBID  Take 1 capsule (100 mg total) by mouth two (2) times a day for 7 days.   ondansetron  4 MG disintegrating tablet Commonly known as: ZOFRAN -ODT Take 1 tablet (4 mg total) by mouth every eight (8) hours as needed for nausea for up to 7 days.       ASK your doctor about these medications    albuterol  90 mcg/actuation inhaler Commonly known as: PROVENTIL  HFA;VENTOLIN  HFA INHALE 2 PUFFS BY MOUTH EVERY 6 HOURS AS NEEDED FOR WHEEZING AND FOR SHORTNESS OF BREATH   atorvastatin  80 MG tablet Commonly known as: LIPITOR Take 1 tablet (80 mg total) by mouth daily.   celecoxib  200 MG capsule Commonly known as: CeleBREX  TAKE 1 CAPSULE BY MOUTH ONCE DAILY WITH FOOD   cetirizine  10 MG tablet Commonly known as: ZYRTEC  TAKE 1 Tablet BY MOUTH ONCE EVERY DAY AS NEEDED FOR ALLERGIES   diclofenac  sodium 1 %  gel Commonly known as: VOLTAREN  Apply 2 g topically.   lansoprazole  30 MG capsule Commonly known as: PREVACID  Take 1 capsule (30 mg total) by mouth.          Kopel, Andrew Lee, [place] 07/17/24 1351

## 2024-07-17 NOTE — Telephone Encounter (Signed)
 Advised to go to ED.

## 2024-07-19 ENCOUNTER — Emergency Department (HOSPITAL_COMMUNITY)

## 2024-07-19 ENCOUNTER — Emergency Department (HOSPITAL_COMMUNITY)
Admission: EM | Admit: 2024-07-19 | Discharge: 2024-07-19 | Disposition: A | Discharge status: Home / Self Care | Attending: Emergency Medicine | Admitting: Emergency Medicine

## 2024-07-19 ENCOUNTER — Other Ambulatory Visit: Payer: Self-pay

## 2024-07-19 ENCOUNTER — Encounter (HOSPITAL_COMMUNITY): Payer: Self-pay

## 2024-07-19 DIAGNOSIS — R1030 Lower abdominal pain, unspecified: Secondary | ICD-10-CM | POA: Insufficient documentation

## 2024-07-19 DIAGNOSIS — N281 Cyst of kidney, acquired: Secondary | ICD-10-CM | POA: Diagnosis not present

## 2024-07-19 DIAGNOSIS — R1031 Right lower quadrant pain: Secondary | ICD-10-CM | POA: Diagnosis not present

## 2024-07-19 DIAGNOSIS — N2889 Other specified disorders of kidney and ureter: Secondary | ICD-10-CM | POA: Diagnosis not present

## 2024-07-19 DIAGNOSIS — K5730 Diverticulosis of large intestine without perforation or abscess without bleeding: Secondary | ICD-10-CM | POA: Diagnosis not present

## 2024-07-19 LAB — URINALYSIS, ROUTINE W REFLEX MICROSCOPIC
Bilirubin Urine: NEGATIVE
Glucose, UA: NEGATIVE mg/dL
Hgb urine dipstick: NEGATIVE
Ketones, ur: NEGATIVE mg/dL
Leukocytes,Ua: NEGATIVE
Nitrite: NEGATIVE
Protein, ur: NEGATIVE mg/dL
Specific Gravity, Urine: 1.006 (ref 1.005–1.030)
pH: 6 (ref 5.0–8.0)

## 2024-07-19 LAB — CBC WITH DIFFERENTIAL/PLATELET
Abs Immature Granulocytes: 0.02 K/uL (ref 0.00–0.07)
Basophils Absolute: 0.1 K/uL (ref 0.0–0.1)
Basophils Relative: 1 %
Eosinophils Absolute: 0.2 K/uL (ref 0.0–0.5)
Eosinophils Relative: 2 %
HCT: 42 % (ref 36.0–46.0)
Hemoglobin: 13.7 g/dL (ref 12.0–15.0)
Immature Granulocytes: 0 %
Lymphocytes Relative: 26 %
Lymphs Abs: 2.4 K/uL (ref 0.7–4.0)
MCH: 29.2 pg (ref 26.0–34.0)
MCHC: 32.6 g/dL (ref 30.0–36.0)
MCV: 89.6 fL (ref 80.0–100.0)
Monocytes Absolute: 0.8 K/uL (ref 0.1–1.0)
Monocytes Relative: 9 %
Neutro Abs: 5.7 K/uL (ref 1.7–7.7)
Neutrophils Relative %: 62 %
Platelets: 341 K/uL (ref 150–400)
RBC: 4.69 MIL/uL (ref 3.87–5.11)
RDW: 13.3 % (ref 11.5–15.5)
WBC: 9.1 K/uL (ref 4.0–10.5)
nRBC: 0 % (ref 0.0–0.2)

## 2024-07-19 LAB — COMPREHENSIVE METABOLIC PANEL WITH GFR
ALT: 13 U/L (ref 0–44)
AST: 21 U/L (ref 15–41)
Albumin: 3.8 g/dL (ref 3.5–5.0)
Alkaline Phosphatase: 91 U/L (ref 38–126)
Anion gap: 14 (ref 5–15)
BUN: 12 mg/dL (ref 8–23)
CO2: 22 mmol/L (ref 22–32)
Calcium: 8.7 mg/dL — ABNORMAL LOW (ref 8.9–10.3)
Chloride: 101 mmol/L (ref 98–111)
Creatinine, Ser: 0.75 mg/dL (ref 0.44–1.00)
GFR, Estimated: >60 mL/min (ref >60)
Glucose, Bld: 84 mg/dL (ref 70–99)
Potassium: 4.1 mmol/L (ref 3.5–5.1)
Sodium: 137 mmol/L (ref 135–145)
Total Bilirubin: 0.9 mg/dL (ref 0.0–1.2)
Total Protein: 7.3 g/dL (ref 6.5–8.1)

## 2024-07-19 LAB — LACTIC ACID, PLASMA
Lactic Acid, Venous: 2.1 mmol/L (ref 0.5–1.9)
Lactic Acid, Venous: 1.4 mmol/L (ref 0.5–1.9)

## 2024-07-19 LAB — LIPASE, BLOOD: Lipase: 33 U/L (ref 11–51)

## 2024-07-19 MED ORDER — ONDANSETRON 4 MG PO TBDP: 4.0000 mg | ORAL_TABLET | Freq: Three times a day (TID) | ORAL | 0 refills | Status: AC | PRN

## 2024-07-19 MED ORDER — OXYCODONE-ACETAMINOPHEN 5-325 MG PO TABS: 1.0000 | ORAL_TABLET | Freq: Four times a day (QID) | ORAL | 0 refills | Status: AC | PRN

## 2024-07-19 MED ORDER — KETOROLAC TROMETHAMINE 15 MG/ML IJ SOLN
15.0000 mg | Freq: Once | INTRAMUSCULAR | Status: AC
  Administered 2024-07-19: 15 mg via INTRAVENOUS
  Filled 2024-07-19: qty 1

## 2024-07-19 MED ORDER — PHENAZOPYRIDINE HCL 200 MG PO TABS: 200.0000 mg | ORAL_TABLET | Freq: Three times a day (TID) | ORAL | 0 refills | Status: AC

## 2024-07-19 MED ORDER — IOHEXOL 300 MG/ML  SOLN
100.0000 mL | Freq: Once | INTRAMUSCULAR | Status: AC | PRN
  Administered 2024-07-19: 100 mL via INTRAVENOUS

## 2024-07-19 MED ORDER — ONDANSETRON HCL 4 MG/2ML IJ SOLN
4.0000 mg | Freq: Once | INTRAMUSCULAR | Status: AC
  Administered 2024-07-19: 4 mg via INTRAVENOUS
  Filled 2024-07-19: qty 2

## 2024-07-19 MED ORDER — SODIUM CHLORIDE 0.9 % IV BOLUS
1000.0000 mL | Freq: Once | INTRAVENOUS | Status: AC
  Administered 2024-07-19: 1000 mL via INTRAVENOUS

## 2024-07-19 MED ORDER — FENTANYL CITRATE (PF) 100 MCG/2ML IJ SOLN
25.0000 ug | Freq: Once | INTRAMUSCULAR | Status: AC
  Administered 2024-07-19: 25 ug via INTRAVENOUS
  Filled 2024-07-19: qty 2

## 2024-07-19 NOTE — ED Triage Notes (Signed)
 Pt diagnosed with UTI yesterday and is now complaining of flank pain and abd pain.

## 2024-07-19 NOTE — ED Provider Notes (Signed)
 [place] EMERGENCY DEPARTMENT AT [facility] Provider Note   CSN: [id number] Arrival date & time: 07/19/24  1327     Patient presents with: UTI   Jennifer Morton is a 63 y.o. female.   Patient is a 63 year old female who presents to emergency department the chief complaint of lower abdominal pain and right flank pain.  She notes that she was evaluated at another emergency department yesterday and diagnosed with a urinary tract infection and placed on Macrobid .  Patient notes that she does have a history of bladder prolapse and is due to have surgery for this.  Patient notes that she has had no associated fever or chills.  She denies any chest pain or shortness of breath.  She denies any constipation or diarrhea.  There has been no associated nausea or vomiting.        Prior to Admission medications   Medication Sig Start Date End Date Taking? Authorizing Provider  albuterol  (VENTOLIN  HFA) 108 (90 Base) MCG/ACT inhaler Inhale 2 puffs into the lungs every 6 (six) hours as needed for wheezing or shortness of breath. 04/12/22   Zollie Lowers, MD  atorvastatin  (LIPITOR) 80 MG tablet Take 1 tablet (80 mg total) by mouth daily. 04/12/22   Zollie Lowers, MD  cetirizine  (ZYRTEC ) 10 MG tablet TAKE 1 Tablet BY MOUTH ONCE EVERY DAY AS NEEDED FOR ALLERGIES 04/12/22   Zollie Lowers, MD  ciprofloxacin (CIPRO) 500 MG tablet Take 500 mg by mouth 2 (two) times daily. 04/26/24   [provider]  HYDROcodone -acetaminophen  (NORCO/VICODIN) 5-325 MG tablet Take 1 tablet by mouth every 4 (four) hours as needed for moderate pain (pain score 4-6). Patient not taking: Reported on 05/06/2024 04/14/24   [provider]  methocarbamol  (ROBAXIN ) 500 MG tablet Take 500 mg by mouth 2 (two) times daily. Patient not taking: Reported on 05/06/2024 04/14/24   [provider]  naproxen (NAPROSYN) 500 MG tablet Take 500 mg by mouth 2 (two) times daily. Patient not taking: Reported on 05/06/2024  04/14/24   [provider]  omeprazole  (PRILOSEC) 20 MG capsule Take 40 mg by mouth daily.    [provider]    Allergies: Penicillins and Prednisone    Review of Systems  Gastrointestinal:  Positive for abdominal pain.  Genitourinary:  Positive for dysuria.  All other systems reviewed and are negative.   Updated Vital Signs BP 101/72   Pulse 66   Temp (!) 97.5 F (36.4 C) (Oral)   Resp 18   Ht 5' 3 (1.6 m)   Wt 77.1 kg   SpO2 96%   BMI 30.11 kg/m   Physical Exam Vitals and nursing note reviewed.  Constitutional:      General: She is not in acute distress.    Appearance: Normal appearance. She is not ill-appearing.  HENT:     Head: Normocephalic and atraumatic.     Nose: Nose normal.     Mouth/Throat:     Mouth: Mucous membranes are moist.  Eyes:     Extraocular Movements: Extraocular movements intact.     Conjunctiva/sclera: Conjunctivae normal.     Pupils: Pupils are equal, round, and reactive to light.  Cardiovascular:     Rate and Rhythm: Normal rate and regular rhythm.     Pulses: Normal pulses.     Heart sounds: Normal heart sounds. No murmur heard.    No gallop.  Pulmonary:     Effort: Pulmonary effort is normal. No respiratory distress.  Breath sounds: Normal breath sounds. No stridor. No wheezing, rhonchi or rales.  Abdominal:     General: Abdomen is flat. Bowel sounds are normal. There is no distension.     Palpations: Abdomen is soft.     Tenderness: There is no guarding.     Comments: Tender palpation over lower abdomen  Musculoskeletal:        General: Normal range of motion.     Cervical back: Normal range of motion and neck supple. No rigidity or tenderness.  Skin:    General: Skin is warm and dry.     Findings: No bruising or rash.  Neurological:     General: No focal deficit present.     Mental Status: She is alert and oriented to person, place, and time. Mental status is at baseline.  Psychiatric:        Mood and  Affect: Mood normal.        Behavior: Behavior normal.        Thought Content: Thought content normal.        Judgment: Judgment normal.     (all labs ordered are listed, but only abnormal results are displayed) Labs Reviewed  URINE CULTURE  URINALYSIS, ROUTINE W REFLEX MICROSCOPIC  COMPREHENSIVE METABOLIC PANEL WITH GFR  CBC WITH DIFFERENTIAL/PLATELET  LACTIC ACID, PLASMA  LACTIC ACID, PLASMA    EKG: None  Radiology: No results found.   Procedures   Medications Ordered in the ED  fentaNYL  (SUBLIMAZE ) injection 25 mcg (has no administration in time range)  ondansetron  (ZOFRAN ) injection 4 mg (has no administration in time range)  ketorolac  (TORADOL ) 15 MG/ML injection 15 mg (has no administration in time range)  sodium chloride  0.9 % bolus 1,000 mL (has no administration in time range)                                    Medical Decision Making Amount and/or Complexity of Data Reviewed Labs: ordered. Radiology: ordered.  Risk Prescription drug management.   This patient presents to the ED for concern of abdominal pain differential diagnosis includes acute appendicitis, cholecystitis, bowel obstruction, diverticulitis, ovarian torsion or cyst, PID, tubo-ovarian abscess, pyelonephritis, kidney stone, pancreatitis, mesenteric ischemia    Additional history obtained:  Additional history obtained from medical records External records from outside source obtained and reviewed including records   Lab Tests:  I Ordered, and personally interpreted labs.  The pertinent results include: No leukocytosis, no anemia, normal kidney function liver function, normal electrolytes, negative lactic acid, normal lipase, unremarkable urinalysis   Imaging Studies ordered:  I ordered imaging studies including CT scan abdomen and pelvis I independently visualized and interpreted imaging which showed mild pancreatic inflammation, no other acute process I agree with the radiologist  interpretation   Medicines ordered and prescription drug management:  I ordered medication including fentanyl , Zofran , Toradol , IV fluids for abdominal pain Reevaluation of the patient after these medicines showed that the patient improved I have reviewed the patients home medicines and have made adjustments as needed   Problem List / ED Course:  Patient is doing well at this time and is stable for discharge home.  Vital signs do remain stable at this point to the most recent blood pressure that checked was 160 systolic.  Patient notes that symptoms have improved with treatment in the emergency department.  Did discuss with patient that all workup in the emergency department has been  overall unremarkable.  Did discuss with her about the mild pancreatic inflammation.  Will provide short course of pain control as well as discussed diet changes over the next few days.  Also suspect the patient's symptoms may be due to underlying interstitial cystitis.  Will provide short course of Pyridium .  She is already scheduled to see urogyn for history of bladder prolapse.  She has no indication of urinary tract infection at this point.  She has no indication for sepsis.  Do not suspect any further workup or admission is warranted at this time.  The need for close follow-up on an outpatient basis was discussed as well as strict turn precautions for any new or worsening symptoms.  Patient voiced understanding and had no additional questions.   Social Determinants of Health:  None        Final diagnoses:  None    ED Discharge Orders     None          Daralene Lonni JONETTA DEVONNA 07/19/24 CATHLYN Cleotilde Rogue, MD 07/20/24 2235

## 2024-07-19 NOTE — Discharge Instructions (Signed)
 Please follow-up closely with your primary care doctor on an outpatient basis.  Will try a clear liquid diet over the next few days and slowly advance as you tolerate.  Return to the emergency department immediately for any new or worsening symptoms.

## 2024-07-21 LAB — URINE CULTURE: Culture: <10000 — AB

## 2024-07-21 MED FILL — Oxycodone w/ Acetaminophen Tab 5-325 MG: ORAL | Qty: 6 | Status: AC

## 2024-07-29 DIAGNOSIS — Z6830 Body mass index (BMI) 30.0-30.9, adult: Secondary | ICD-10-CM | POA: Diagnosis not present

## 2024-07-29 DIAGNOSIS — R030 Elevated blood-pressure reading, without diagnosis of hypertension: Secondary | ICD-10-CM | POA: Diagnosis not present

## 2024-07-29 DIAGNOSIS — E669 Obesity, unspecified: Secondary | ICD-10-CM | POA: Diagnosis not present

## 2024-07-29 DIAGNOSIS — S93402A Sprain of unspecified ligament of left ankle, initial encounter: Secondary | ICD-10-CM | POA: Diagnosis not present

## 2024-11-10 ENCOUNTER — Ambulatory Visit: Payer: Self-pay

## 2024-11-10 NOTE — Telephone Encounter (Signed)
Noted  -LS

## 2024-11-10 NOTE — Telephone Encounter (Addendum)
 FYI Only or Action Required?: FYI only for provider: 911 advised, patient aunt called 911 for patient . 911 on scene   Patient was last seen in primary care on 10/12/2022 by Zollie Lowers, MD.  Called Nurse Triage reporting Headache and Chest Pain.  Symptoms began several days ago.  Interventions attempted: Other: n.a.  Symptoms are: gradually worsening.  Triage Disposition: Call EMS 911 Now  Patient/caregiver understands and will follow disposition?: Yes               Reason for Disposition  Sounds like a life-threatening emergency to the triager    Chest pain today lasting 5-10 mintues  going intol jaw  Answer Assessment - Initial Assessment Questions Patient reports worst headache of life , and reporting chest pain that radiating into jaw today and new onset shortness of breath this RN  advised 911 now patient agreeable and states   her aunt will call now called patient back to ensure reached 911, spoke with patient , pts aunt on phone with EMS now , can hear someone talking with EMS in background of call.     1. LOCATION: Where does it hurt?      All over  mostly head  2. ONSET: When did the headache start? (e.g., minutes, hours, days)      Couple days of symptoms, getting worse   4. SEVERITY: How bad is the pain? and What does it keep you from doing?  (e.g., Scale 1-10; mild, moderate, or severe)     101/0 worst headache of life  9. OTHER SYMPTOMS: Do you have any other symptoms? (e.g., fever, stiff neck, eye pain, sore throat, cold symptoms)     Chest pain today lasting 5-10 mins happening now sometimes radiating  Protocols used: Headache-A-AH  Copied from CRM #[id number]. Topic: Clinical - Red Word Triage >> Nov 10, 2024 10:40 AM Cherylann RAMAN wrote: Red Word that prompted transfer to Nurse Triage: Patient called in with complaints of severe pain all over her body especially her head. Patient also states she has diarrhea and vomiting after she has  eaten/
